# Patient Record
Sex: Female | Born: 1935
Health system: Southern US, Community
[De-identification: ages and names within clinical notes are randomized; demographics above are authoritative.]

---

## 2006-01-08 ENCOUNTER — Ambulatory Visit: Payer: Self-pay | Admitting: Specialist

## 2006-01-08 ENCOUNTER — Other Ambulatory Visit: Payer: Self-pay

## 2007-02-27 ENCOUNTER — Ambulatory Visit: Payer: Self-pay | Admitting: Specialist

## 2007-04-01 ENCOUNTER — Ambulatory Visit: Payer: Self-pay | Admitting: Specialist

## 2008-03-02 ENCOUNTER — Ambulatory Visit: Payer: Self-pay | Admitting: Specialist

## 2009-04-19 ENCOUNTER — Ambulatory Visit: Payer: Self-pay | Admitting: Specialist

## 2010-05-28 ENCOUNTER — Inpatient Hospital Stay: Payer: Self-pay | Admitting: Specialist

## 2010-06-20 ENCOUNTER — Ambulatory Visit: Payer: Self-pay | Admitting: Specialist

## 2010-10-15 ENCOUNTER — Inpatient Hospital Stay: Payer: Self-pay | Admitting: Internal Medicine

## 2010-10-27 ENCOUNTER — Ambulatory Visit: Payer: Self-pay | Admitting: Surgery

## 2010-11-03 ENCOUNTER — Ambulatory Visit: Payer: Self-pay | Admitting: Surgery

## 2011-04-04 ENCOUNTER — Ambulatory Visit: Payer: Self-pay | Admitting: Specialist

## 2011-12-24 ENCOUNTER — Ambulatory Visit: Payer: Self-pay | Admitting: Specialist

## 2012-11-30 IMAGING — CT CT ABD-PELV W/ CM
1 of 2 series · 15 of 32 positions shown, 19 images · IV contrast (isovue)
Comparison: None

REASON FOR EXAM: (1) pancreatitis; (2) pancreatitis
COMMENTS:

PROCEDURE:     CT  - CT ABDOMEN / PELVIS  W  - October 15, 2010  [DATE]
RESULT:     History: Pancreatitis
TECHNIQUE: Multiple axial images of the abdomen and pelvis were performed
from the lung bases to the pubic symphysis, with p.o. contrast and with 100
ml of Isovue 370 intravenous contrast.

[Series 2: 3mm soft tissue · axial · 0.66mm/px · z∈[-1598,-1202]mm · 15 of 145 slices shown, 19 images]
[im 7/145  soft-tissue]
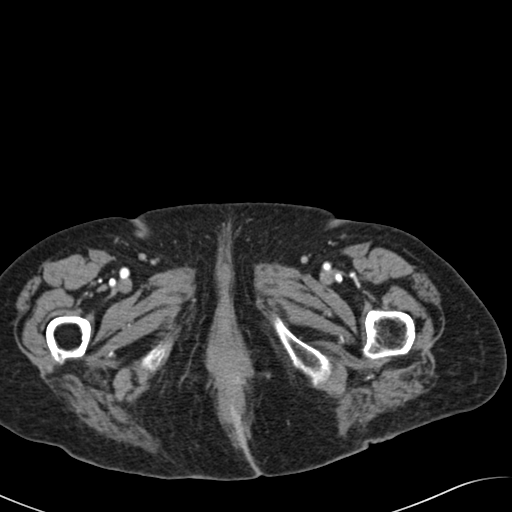
[im 7/145  bone]
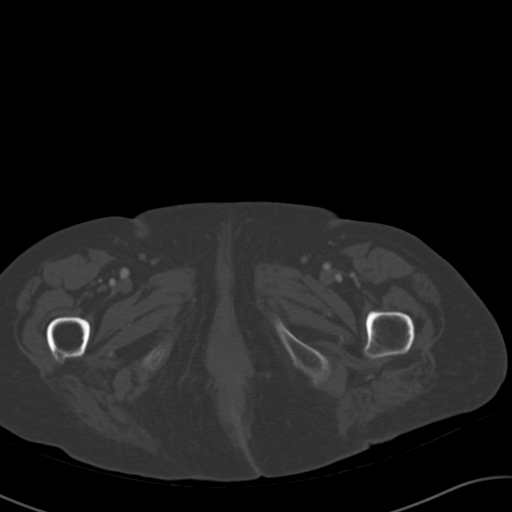
[im 19/145  soft-tissue]
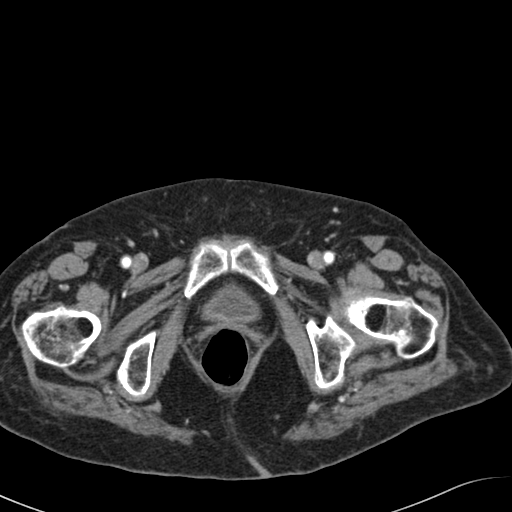
[im 31/145  soft-tissue]
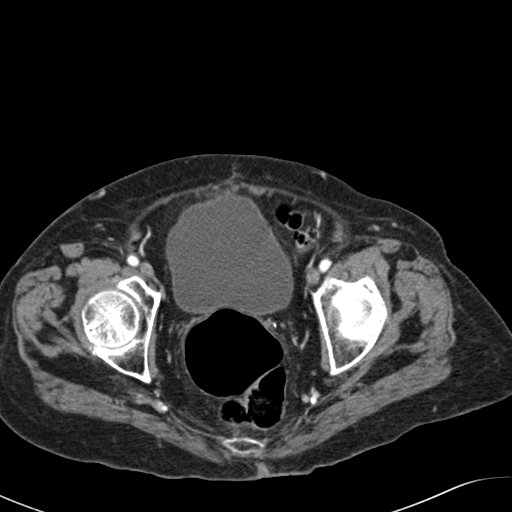
[im 43/145  soft-tissue]
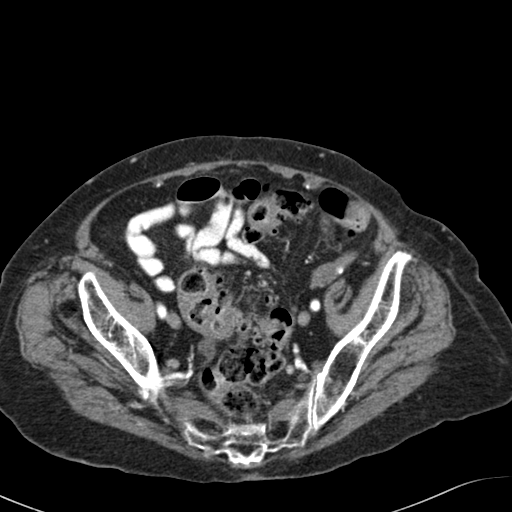
[im 49/145  soft-tissue]
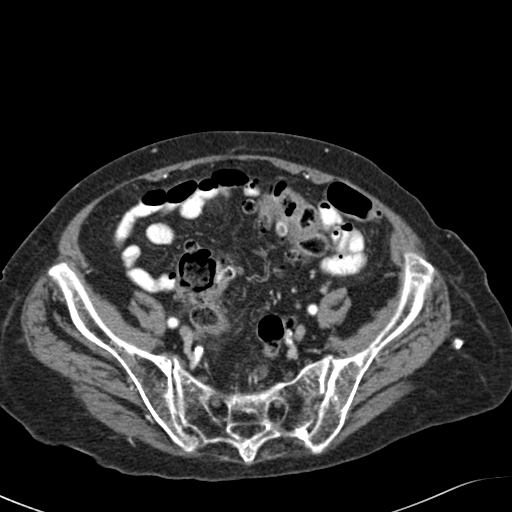
[im 61/145  soft-tissue]
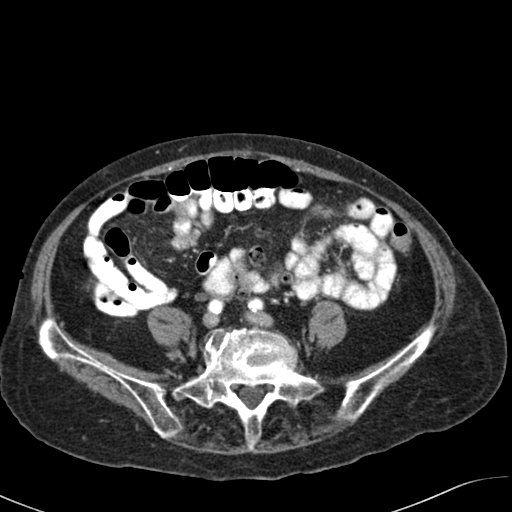
[im 73/145  soft-tissue]
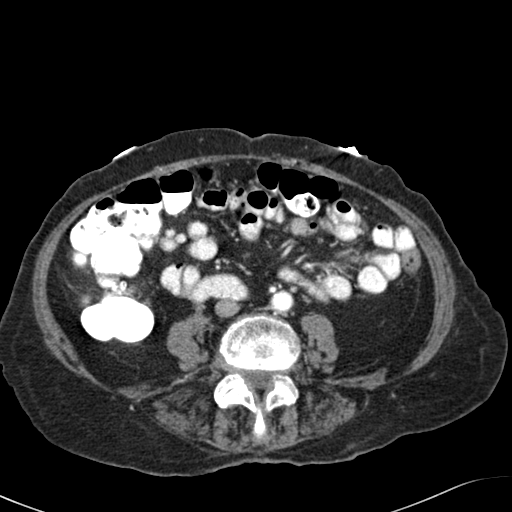
[im 85/145  soft-tissue]
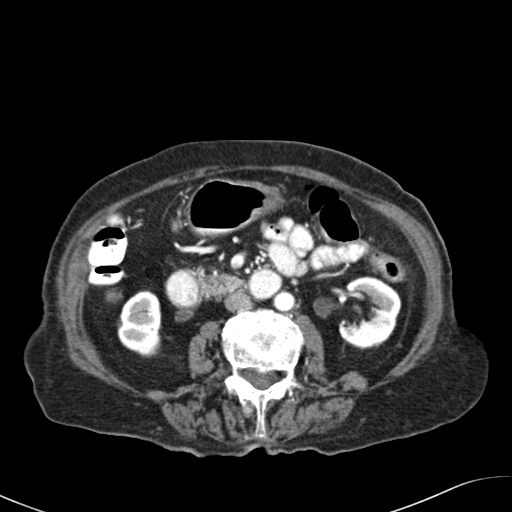
[im 97/145  soft-tissue]
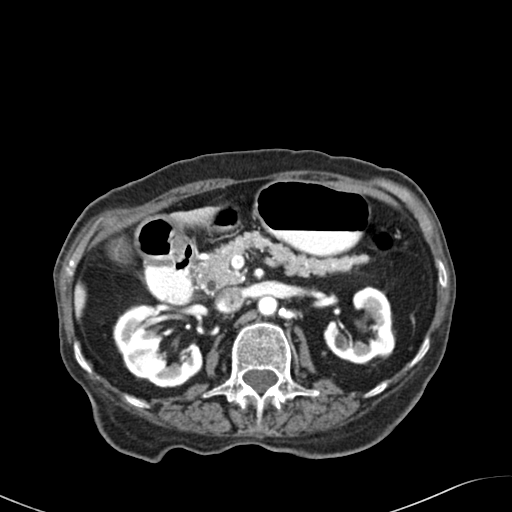
[im 97/145  bone]
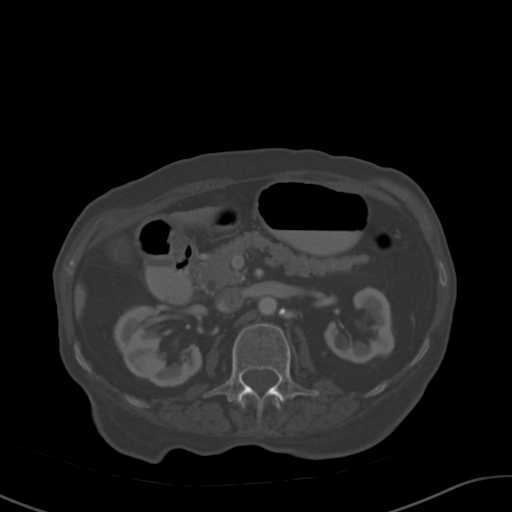
[im 103/145  soft-tissue]
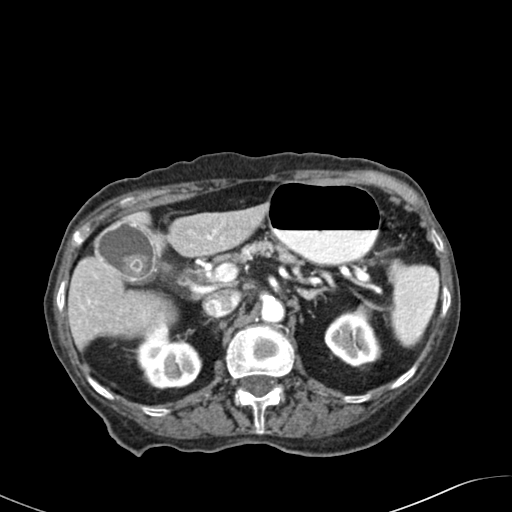
[im 115/145  soft-tissue]
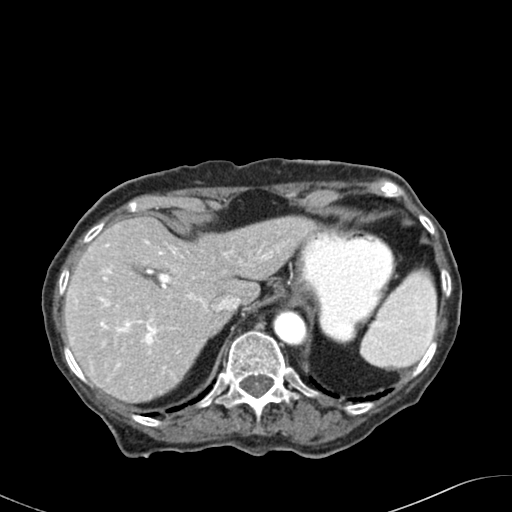
[im 121/145  lung]
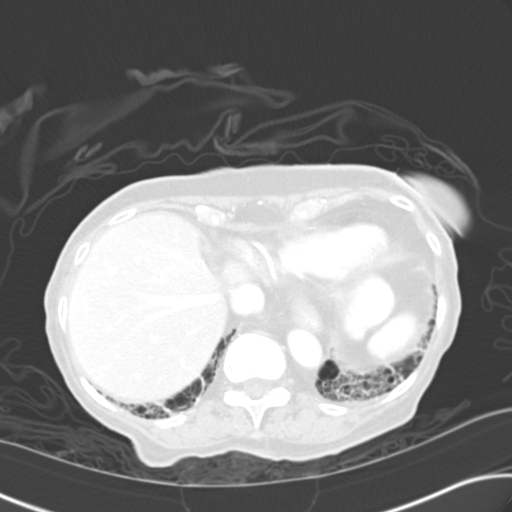
[im 127/145  soft-tissue]
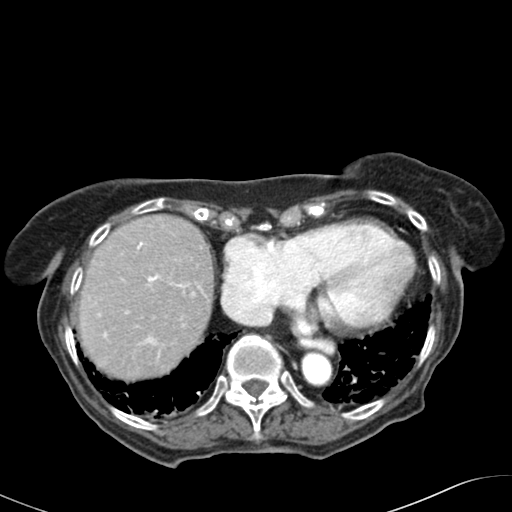
[im 127/145  lung]
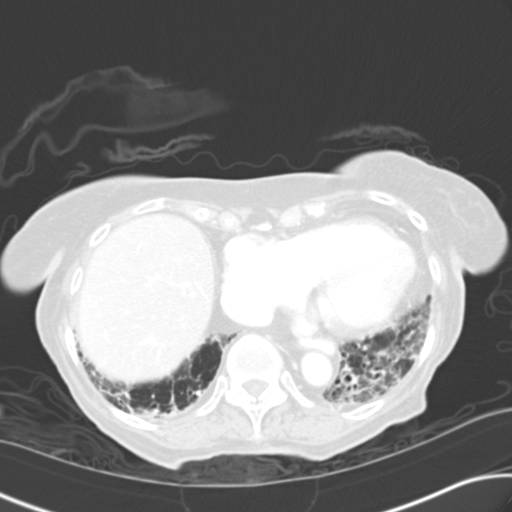
[im 133/145  lung]
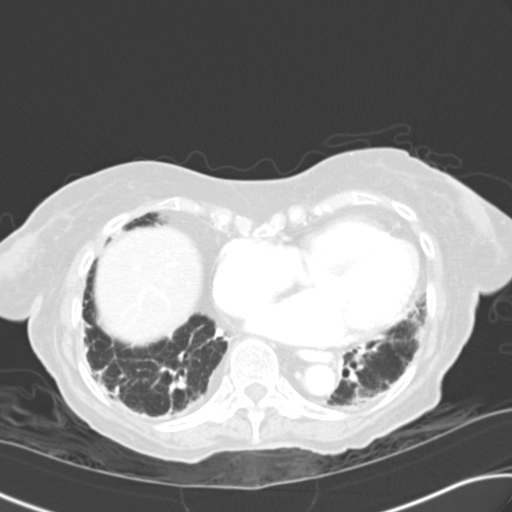
[im 139/145  soft-tissue]
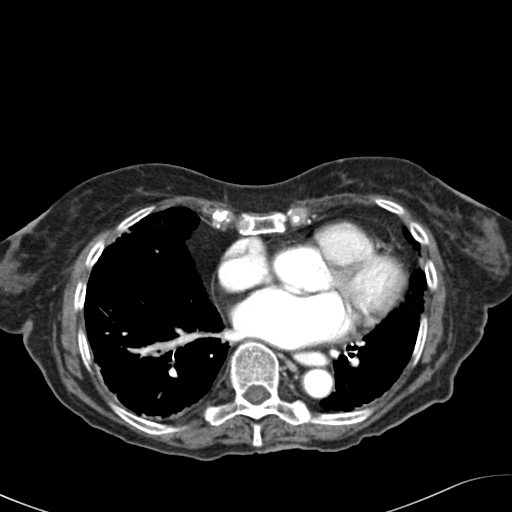
[im 139/145  lung]
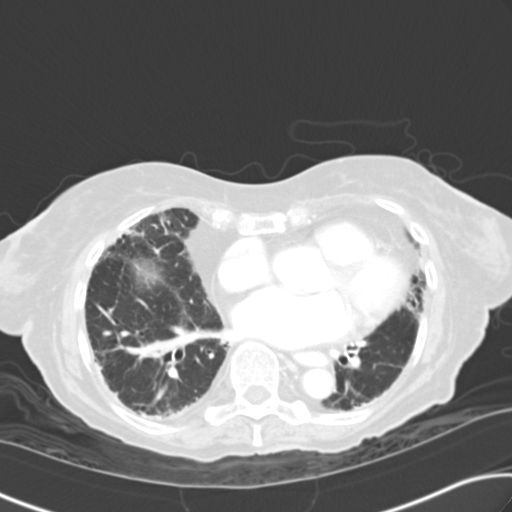

[15 of 32 positions shown; findings below may reference images not displayed]

FINDINGS: There is bilateral lower lobe fibrosis. There is no pneumothorax. The heart
size is normal.

The liver demonstrates no focal abnormality. There is no intrahepatic or
extrahepatic biliary ductal dilatation. The gallbladder is distended with
cholelithiasis. There is gallbladder wall thickening. The spleen
demonstrates no focal abnormality. The kidneys, adrenal glands, and pancreas
are normal. The bladder is unremarkable.

The stomach, duodenum, small intestine, and large intestine demonstrate no
contrast extravasation or dilatation. There is diverticulosis without
evidence of diverticulitis. There is no pneumoperitoneum, pneumatosis, or
portal venous gas. There is no abdominal or pelvic free fluid. There is no
lymphadenopathy.

The abdominal aorta is normal in caliber with atherosclerosis.

There is lumbar spine spondylosis.
IMPRESSION: 1. There is diverticulosis without evidence of diverticulitis.

2. Cholelithiasis with gallbladder wall thickening as can be seen with
cholecystitis. If there is further clinical concern regarding cholecystitis
recommend further evaluation with a HIDA scan.

## 2012-12-14 LAB — URINALYSIS, COMPLETE
Bilirubin,UR: NEGATIVE
Blood: NEGATIVE
Glucose,UR: NEGATIVE mg/dL (ref 0–75)
Nitrite: POSITIVE
Ph: 5 (ref 4.5–8.0)
RBC,UR: 2 /HPF (ref 0–5)
Specific Gravity: 1.013 (ref 1.003–1.030)
WBC UR: 29 /HPF (ref 0–5)

## 2012-12-14 LAB — CBC
HCT: 36.2 % (ref 35.0–47.0)
HGB: 12.5 g/dL (ref 12.0–16.0)
MCHC: 34.5 g/dL (ref 32.0–36.0)
MCV: 95 fL (ref 80–100)
Platelet: 227 10*3/uL (ref 150–440)
RBC: 3.8 10*6/uL (ref 3.80–5.20)
RDW: 15.3 % — ABNORMAL HIGH (ref 11.5–14.5)
WBC: 12.5 10*3/uL — ABNORMAL HIGH (ref 3.6–11.0)

## 2012-12-14 LAB — COMPREHENSIVE METABOLIC PANEL
Albumin: 3.6 g/dL (ref 3.4–5.0)
Calcium, Total: 10.1 mg/dL (ref 8.5–10.1)
Chloride: 100 mmol/L (ref 98–107)
EGFR (African American): >60
EGFR (Non-African Amer.): >60
Glucose: 87 mg/dL (ref 65–99)
Potassium: 3.9 mmol/L (ref 3.5–5.1)
Sodium: 133 mmol/L — ABNORMAL LOW (ref 136–145)
Total Protein: 7.7 g/dL (ref 6.4–8.2)

## 2012-12-15 ENCOUNTER — Observation Stay: Payer: Self-pay | Admitting: Internal Medicine

## 2012-12-15 LAB — CBC WITH DIFFERENTIAL/PLATELET
Eosinophil #: 0 10*3/uL (ref 0.0–0.7)
Eosinophil %: 0.1 %
HCT: 33.4 % — ABNORMAL LOW (ref 35.0–47.0)
HGB: 11.5 g/dL — ABNORMAL LOW (ref 12.0–16.0)
MCH: 32.8 pg (ref 26.0–34.0)
MCHC: 34.5 g/dL (ref 32.0–36.0)
MCV: 95 fL (ref 80–100)
Monocyte #: 0.1 x10 3/mm — ABNORMAL LOW (ref 0.2–0.9)
Monocyte %: 1.3 %
RBC: 3.51 10*6/uL — ABNORMAL LOW (ref 3.80–5.20)
RDW: 15.8 % — ABNORMAL HIGH (ref 11.5–14.5)

## 2012-12-15 LAB — CK TOTAL AND CKMB (NOT AT ARMC)
CK, Total: 187 U/L (ref 21–215)
CK, Total: 133 U/L (ref 21–215)
CK-MB: 3 ng/mL (ref 0.5–3.6)

## 2012-12-15 LAB — BASIC METABOLIC PANEL
Chloride: 101 mmol/L (ref 98–107)
Creatinine: 0.75 mg/dL (ref 0.60–1.30)
EGFR (African American): >60
Potassium: 4.1 mmol/L (ref 3.5–5.1)

## 2012-12-15 LAB — TROPONIN I: Troponin-I: 0.04 ng/mL

## 2012-12-17 LAB — BASIC METABOLIC PANEL
Co2: 27 mmol/L (ref 21–32)
Creatinine: 1.01 mg/dL (ref 0.60–1.30)
EGFR (African American): 54 — ABNORMAL LOW
EGFR (Non-African Amer.): 47 — ABNORMAL LOW
Potassium: 3.8 mmol/L (ref 3.5–5.1)

## 2013-01-09 ENCOUNTER — Inpatient Hospital Stay: Payer: Self-pay | Admitting: Student

## 2013-01-09 LAB — COMPREHENSIVE METABOLIC PANEL
Albumin: 2.9 g/dL — ABNORMAL LOW (ref 3.4–5.0)
Alkaline Phosphatase: 114 U/L (ref 50–136)
Anion Gap: 10 (ref 7–16)
BUN: 19 mg/dL — ABNORMAL HIGH (ref 7–18)
Bilirubin,Total: 0.6 mg/dL (ref 0.2–1.0)
Calcium, Total: 9.5 mg/dL (ref 8.5–10.1)
Chloride: 95 mmol/L — ABNORMAL LOW (ref 98–107)
EGFR (African American): >60
EGFR (Non-African Amer.): >60
Glucose: 104 mg/dL — ABNORMAL HIGH (ref 65–99)
Potassium: 3.8 mmol/L (ref 3.5–5.1)
SGOT(AST): 26 U/L (ref 15–37)
Sodium: 128 mmol/L — ABNORMAL LOW (ref 136–145)
Total Protein: 6.5 g/dL (ref 6.4–8.2)

## 2013-01-09 LAB — CBC
HGB: 11.9 g/dL — ABNORMAL LOW (ref 12.0–16.0)
MCH: 32.4 pg (ref 26.0–34.0)
MCV: 94 fL (ref 80–100)
Platelet: 257 10*3/uL (ref 150–440)
RBC: 3.68 10*6/uL — ABNORMAL LOW (ref 3.80–5.20)
RDW: 15.7 % — ABNORMAL HIGH (ref 11.5–14.5)
WBC: 12.2 10*3/uL — ABNORMAL HIGH (ref 3.6–11.0)

## 2013-01-09 LAB — URINALYSIS, COMPLETE
Bilirubin,UR: NEGATIVE
Blood: NEGATIVE
Nitrite: NEGATIVE
Ph: 5 (ref 4.5–8.0)
Protein: NEGATIVE
RBC,UR: 2 /HPF (ref 0–5)
Squamous Epithelial: <1

## 2013-01-10 LAB — CBC WITH DIFFERENTIAL/PLATELET
Basophil #: 0 10*3/uL (ref 0.0–0.1)
Basophil %: 0.4 %
Eosinophil #: 0.1 10*3/uL (ref 0.0–0.7)
HGB: 10.8 g/dL — ABNORMAL LOW (ref 12.0–16.0)
Lymphocyte #: 2 10*3/uL (ref 1.0–3.6)
Lymphocyte %: 22.5 %
MCH: 32.3 pg (ref 26.0–34.0)
MCV: 95 fL (ref 80–100)
Monocyte #: 0.7 x10 3/mm (ref 0.2–0.9)
Neutrophil #: 5.9 10*3/uL (ref 1.4–6.5)
Platelet: 230 10*3/uL (ref 150–440)
RBC: 3.35 10*6/uL — ABNORMAL LOW (ref 3.80–5.20)
RDW: 15.8 % — ABNORMAL HIGH (ref 11.5–14.5)

## 2013-01-10 LAB — COMPREHENSIVE METABOLIC PANEL
Albumin: 2.4 g/dL — ABNORMAL LOW (ref 3.4–5.0)
Alkaline Phosphatase: 95 U/L (ref 50–136)
BUN: 16 mg/dL (ref 7–18)
Chloride: 99 mmol/L (ref 98–107)
EGFR (African American): >60
EGFR (Non-African Amer.): >60
Glucose: 67 mg/dL (ref 65–99)
Potassium: 3.9 mmol/L (ref 3.5–5.1)
SGOT(AST): 21 U/L (ref 15–37)
Sodium: 130 mmol/L — ABNORMAL LOW (ref 136–145)

## 2013-01-13 LAB — BASIC METABOLIC PANEL
Anion Gap: 3 — ABNORMAL LOW (ref 7–16)
BUN: 10 mg/dL (ref 7–18)
Calcium, Total: 9.8 mg/dL (ref 8.5–10.1)
Chloride: 96 mmol/L — ABNORMAL LOW (ref 98–107)
Creatinine: 0.78 mg/dL (ref 0.60–1.30)
EGFR (Non-African Amer.): >60
Glucose: 109 mg/dL — ABNORMAL HIGH (ref 65–99)

## 2013-01-14 LAB — BASIC METABOLIC PANEL
Anion Gap: 5 — ABNORMAL LOW (ref 7–16)
BUN: 12 mg/dL (ref 7–18)
Chloride: 97 mmol/L — ABNORMAL LOW (ref 98–107)
Co2: 30 mmol/L (ref 21–32)
Creatinine: 0.76 mg/dL (ref 0.60–1.30)
EGFR (Non-African Amer.): >60
Glucose: 112 mg/dL — ABNORMAL HIGH (ref 65–99)
Potassium: 4 mmol/L (ref 3.5–5.1)

## 2013-01-14 LAB — TROPONIN I: Troponin-I: 0.05 ng/mL

## 2013-01-16 ENCOUNTER — Encounter: Payer: Self-pay | Admitting: Internal Medicine

## 2013-01-17 ENCOUNTER — Encounter: Payer: Self-pay | Admitting: Internal Medicine

## 2013-01-22 LAB — TSH: Thyroid Stimulating Horm: 6.14 u[IU]/mL — ABNORMAL HIGH

## 2013-01-25 LAB — FOLATE: Folic Acid: 27.7 ng/mL (ref 3.1–100.0)

## 2013-02-05 LAB — TSH: Thyroid Stimulating Horm: 6.02 u[IU]/mL — ABNORMAL HIGH

## 2013-02-05 LAB — MAGNESIUM: Magnesium: 1.3 mg/dL — ABNORMAL LOW

## 2013-02-16 ENCOUNTER — Ambulatory Visit: Payer: Self-pay | Admitting: Internal Medicine

## 2013-02-16 ENCOUNTER — Encounter: Payer: Self-pay | Admitting: Internal Medicine

## 2013-02-20 LAB — PATHOLOGY REPORT

## 2013-03-15 ENCOUNTER — Inpatient Hospital Stay: Payer: Self-pay | Admitting: Internal Medicine

## 2013-03-15 DIAGNOSIS — I359 Nonrheumatic aortic valve disorder, unspecified: Secondary | ICD-10-CM

## 2013-03-15 LAB — URINALYSIS, COMPLETE
Blood: NEGATIVE
Glucose,UR: NEGATIVE mg/dL (ref 0–75)
Ketone: NEGATIVE
Nitrite: NEGATIVE
Ph: 5 (ref 4.5–8.0)
Specific Gravity: 1.03 (ref 1.003–1.030)
Squamous Epithelial: <1
WBC UR: 78 /HPF (ref 0–5)

## 2013-03-15 LAB — BASIC METABOLIC PANEL
Anion Gap: 4 — ABNORMAL LOW (ref 7–16)
Calcium, Total: 9.4 mg/dL (ref 8.5–10.1)
Co2: 30 mmol/L (ref 21–32)
EGFR (African American): 52 — ABNORMAL LOW
Glucose: 106 mg/dL — ABNORMAL HIGH (ref 65–99)
Osmolality: 283 (ref 275–301)
Potassium: 4.2 mmol/L (ref 3.5–5.1)

## 2013-03-15 LAB — CBC
HCT: 37.7 % (ref 35.0–47.0)
MCHC: 32 g/dL (ref 32.0–36.0)
MCV: 89 fL (ref 80–100)
RBC: 4.25 10*6/uL (ref 3.80–5.20)
RDW: 16.9 % — ABNORMAL HIGH (ref 11.5–14.5)
WBC: 11.5 10*3/uL — ABNORMAL HIGH (ref 3.6–11.0)

## 2013-03-15 LAB — TROPONIN I: Troponin-I: 0.07 ng/mL — ABNORMAL HIGH

## 2013-03-16 DIAGNOSIS — I4891 Unspecified atrial fibrillation: Secondary | ICD-10-CM

## 2013-03-16 LAB — CBC WITH DIFFERENTIAL/PLATELET
Eosinophil #: 0.4 10*3/uL (ref 0.0–0.7)
Lymphocyte %: 12.1 %
MCH: 28.7 pg (ref 26.0–34.0)
MCV: 90 fL (ref 80–100)
Monocyte %: 5.1 %
Platelet: 286 10*3/uL (ref 150–440)
RBC: 4.46 10*6/uL (ref 3.80–5.20)
RDW: 17.2 % — ABNORMAL HIGH (ref 11.5–14.5)

## 2013-03-16 LAB — LIPID PANEL
HDL Cholesterol: 34 mg/dL — ABNORMAL LOW (ref 40–60)
Triglycerides: 118 mg/dL (ref 0–200)
VLDL Cholesterol, Calc: 24 mg/dL (ref 5–40)

## 2013-03-16 LAB — BASIC METABOLIC PANEL
Creatinine: 0.9 mg/dL (ref 0.60–1.30)
Osmolality: 278 (ref 275–301)
Potassium: 4.1 mmol/L (ref 3.5–5.1)

## 2013-03-17 LAB — BASIC METABOLIC PANEL
BUN: 30 mg/dL — ABNORMAL HIGH (ref 7–18)
Creatinine: 1.06 mg/dL (ref 0.60–1.30)
EGFR (Non-African Amer.): 44 — ABNORMAL LOW
Glucose: 137 mg/dL — ABNORMAL HIGH (ref 65–99)
Osmolality: 282 (ref 275–301)
Potassium: 4.4 mmol/L (ref 3.5–5.1)

## 2013-03-17 LAB — WBC: WBC: 18 10*3/uL — ABNORMAL HIGH (ref 3.6–11.0)

## 2013-03-19 ENCOUNTER — Encounter: Payer: Self-pay | Admitting: Internal Medicine

## 2013-03-19 DEATH — deceased

## 2013-04-19 ENCOUNTER — Ambulatory Visit: Payer: Self-pay | Admitting: Internal Medicine

## 2014-07-09 NOTE — Op Note (Signed)
PATIENT NAME:  Melanie Nunez, Melanie Nunez MR#:  [id number] DATE OF BIRTH:  05-16-1914  DATE OF PROCEDURE:  12/16/2012  PREOPERATIVE DIAGNOSIS: L2 compression fracture.   POSTOPERATIVE DIAGNOSIS:  L2 compression fracture.   PROCEDURE:  L2 biopsy and kyphoplasty.   ANESTHESIA:  MAC.   SURGEON:  Coltrane Tugwell.   DESCRIPTION OF PROCEDURE:  The patient was brought to the operating room and after adequate sedation was given, the patient was placed prone. C-arm was brought in and good visualization of the AP and lateral planes was obtained of L2. The patient identification and timeout procedures were completed, and 5 mL of 1% Xylocaine was infiltrated on both sides for potential incision. Next, the back was prepped and draped in the usual sterile fashion, and a repeat timeout procedure was carried out. The right-sided skin incision was made and the trocar advanced in an extrapedicular fashion and advanced into the vertebral body. A biopsy was obtained. There was good fill with first drilling past  the midline felt that only the right side needed to be accessed. A balloon was placed and inflated, followed by cement insertion. The balloon was inflated approximately 4 mL with correction of superior endplate deformity. The balloon was then deflated and cement inserted. With 4.5 mL placed, this gave very good fill with good interdigitation. After the cement had set, the trocar was removed, and wound closed with Dermabond followed by a Band-Aid. The patient was then sent to the recovery room in stable condition.   ESTIMATED BLOOD LOSS: Minimal.   COMPLICATIONS: None.   SPECIMEN: L2 vertebral body biopsy.     ____________________________ Leitha SchullerMichael J. Tkai Serfass, MD mjm:dmm D: 12/16/2012 21:44:30 ET T: 12/16/2012 22:05:28 ET JOB#: [id number]  cc: Leitha SchullerMichael J. Rashidi Loh, MD, <Dictator> Leitha SchullerMICHAEL J Farhiya Rosten MD ELECTRONICALLY SIGNED 12/16/2012 22:21

## 2014-07-09 NOTE — Consult Note (Signed)
Brief Consult Note: Diagnosis: L2 vertebral compression fracture.   Patient was seen by consultant.   Comments: Discussed MRI findings with patient and her granddaughter. Patient was seen earlier by Dr. Rosita KeaMenz and is tentatively scheduled for kyphoplasty tomorrow.  Granddaughter was directed to the [email or web address] website for additional information.  Electronic Signatures: Donato HeinzHooten, James P (MD)  (Signed 29-Sep-14 19:09)  Authored: Brief Consult Note   Last Updated: 29-Sep-14 19:09 by Donato HeinzHooten, James P (MD)

## 2014-07-09 NOTE — Op Note (Signed)
PATIENT NAME:  Melanie Nunez, [name] MR#:  [id number] DATE OF BIRTH:  07-Mar-1915  DATE OF PROCEDURE:  01/13/2013  PREOPERATIVE DIAGNOSIS: T10 and T12 compression fractures with osteoporosis T11.   POSTOPERATIVE DIAGNOSIS: T10 and T12 compression fractures with osteoporosis T11.    PROCEDURE: T10, T11 and T12 kyphoplasty with biopsy of T11 and T12.   ANESTHESIA: MAC.   SURGEON: Kennedy BuckerMichael Kelie Gainey, M.D.   DESCRIPTION OF PROCEDURE: The patient was brought to the operating room and after adequate anesthesia was obtained with sedation, the patient was placed prone. The back was prepped with alcohol, and with good visualization at each level, 1% Xylocaine was infiltrated subcutaneously. Repeat timeout procedure was completed after prepping and draping, and a spinal needle was used to inject down to the vertebral body on both sides at T10 which was the most compressed, on the right at T11 and on the left at T12. Small stab incisions were made, first on the right at T10. Biopsy was not possible. The bone was very soft, and the biopsy needle appeared to fall into a cleft and there was no good bone tissue. The balloon was inflated following this, and it did cross the midline with 4 mL inflation, with good correction of the very compressed T10 vertebral body. T11 was approached on the right as well with biopsy obtained and inflation to 3 mL with low pressure consistent with very osteoporotic bone. On the left, a small stab incision was made, trocar advanced, again checking on AP and lateral projections to be sure it was safe, not within the spinal canal or within the neural foramen. Biopsy obtained. Inflation to 3 mL. At this point the balloons were deflated, and sequential filling of the vertebral bodies was carried out with approximately 4.5 mL in T10 and 3 mL in the remaining levels with good fill and no extravasation. The trocars were removed and AP and lateral images obtained. The wounds were closed with Dermabond  followed by Band-Aids. At the close of the case, the patient was somewhat hypotensive and became quite bradycardic. She was sent to the recovery room in fair condition where medical evaluation was continued with external pacer applied, and she was subsequently sent to CCU in fair condition with bradycardia and hypotension that occurred prior to placing of the bone cement.   SPECIMEN: T11 and T12 vertebral body biopsies.   ____________________________ Leitha SchullerMichael J. Ladon Heney, MD mjm:gb D: 01/13/2013 22:09:14 ET T: 01/13/2013 22:25:07 ET JOB#: [id number]  cc: Leitha SchullerMichael J. Walsie Smeltz, MD, <Dictator> Leitha SchullerMICHAEL J Lisha Vitale MD ELECTRONICALLY SIGNED 01/14/2013 0:01

## 2014-07-09 NOTE — Consult Note (Signed)
Brief Consult Note: Diagnosis: Borderline elevated troponin, neg 2nd trop, no CP, no ECG changes, doubt ACS.   Patient was seen by consultant.   Consult note dictated.   Comments: REC  Agree with current therapy, defer full dose anticoagulation, defer further noninvasive or invasive cardiac eval.  Electronic Signatures: Marcina MillardParaschos, Eschol Auxier (MD)  (Signed 29-Sep-14 12:37)  Authored: Brief Consult Note   Last Updated: 29-Sep-14 12:37 by Marcina MillardParaschos, Jaelynn Currier (MD)

## 2014-07-09 NOTE — Discharge Summary (Signed)
PATIENT NAME:  Melanie Nunez, Melanie Nunez MR#:  [id number] DATE OF BIRTH:  [birth year]  DATE OF ADMISSION:  12/15/2012 DATE OF DISCHARGE:  12/17/2012  ADMITTING PHYSICIAN: Dr. Randol Kern   DISCHARGING PHYSICIAN:  Enid Baas, MD   PRIMARY CARE PHYSICIAN: Dr. Ronna Polio   CONSULTATIONS IN THE HOSPITAL:  1.  Cardiology consultation by Dr. Darrold Junker.   2.  Orthopedic consultation by Dr. Rosita Kea.   DISCHARGE DIAGNOSES:  1.  Acute back pain.  2.  Acute L2 compression fracture, status post kyphoplasty.  3.  Transient bradycardia with atrial flutter while in the hospital.  4.  Urinary tract infection.  5.  Glaucoma.   DISCHARGE MEDICATIONS: 1.  Calcium/Vitamin D 1 tablet p.o. b.i.d.  2.  Metoprolol tartrate 12.5 mg p.o. daily in the morning.  3.  Advair 250/50 1 puff b.i.d.  4.  Dorzolamide and timolol ophthalmic drops twice a day.  5.  Oxybutynin 5 mg p.o. b.i.d.  6.  Cosopt eyedrops to both eyes as scheduled.  7.  Combivent inhaler 2 puffs q. 6 hours p.r.n.  8.  Tramadol 50 mg q. 6 hours p.r.n. for pain.  9.  Levaquin 250 mg p.o. daily for 2 more days.   DISCHARGE DIET: Low-sodium diet.   DISCHARGE ACTIVITY: As tolerated.    FOLLOWUP INSTRUCTIONS: 1.  PCP follow-up in 3 to 4 weeks.  2.  Home health physical therapy.  3.  Follow up with orthopedics in two weeks.   LABORATORY AND IMAGING STUDIES PRIOR TO DISCHARGE:  Sodium 130, potassium 3.8, chloride 99, bicarb 27, BUN 20, creatinine 1.01, glucose 175 and calcium of 9.1, magnesium 1.3 which was replaced.   MRI of the lumbar spine without contrast showing multilevel, multifactorial areas of thecal sac stenosis, severe at L3-L4 and L4-L5 levels, compression deformity at L2 could be acute to subacute, ASF, neuroforaminal narrowing are present.   WBC 6.7, hemoglobin 11.4, hematocrit 33.4, platelet count 204.   Sodium 134, potassium 4.1, chloride 101, bicarbonate 24, BUN 18, creatinine 0.75, glucose 134 and calcium of 9.4. Troponins  negative.   CT of the lumbar spine showing possible compression fraction of L3 and L1, acute fracture involving L2 vertebral body anteriorly is seen. Multilevel disk protrusions and areas of spinal canal stenosis and neuroforaminal stenosis seen.   Urinalysis with 1+ leukocyte esterase, nitrite positive, 3+ bacteria  and 30 WBCs.   BRIEF HOSPITAL COURSE: Ms. Droll is a 81 year old elderly Caucasian female who has past medical history significant for chronic obstructive pulmonary disease, glaucoma, urinary incontinence who is pretty independent at baseline and lives at home by herself, presented to the hospital secondary to low back pain. The patient denies any trauma or recent fall or lifting something heavy.   1.  Acute low back pain this point.  She denies any mechanical trauma, bending. Possible pathological fractures secondary to osteoporosis. The patient had a noted compression fracture noted on CT and also confirmed by MRI. Seen by orthopedics and had kyphoplasty done by Dr. Rosita Kea on 12/16/2012. Postoperatively the patient's pain has improved a lot, worked with physical therapy and was requiring just minimal assistance from getting out of bed or chair. They recommended home PT and the patient will be going home with 24 hour care and also home physical therapy. The patient has not required any postoperative pain medication, so narcotics are not being dispatched and she is being discharged only on tramadol as needed for now.  2.  Urinary tract infection. Unfortunately, cultures were not sent. She  was on Rocephin in the hospital and will finish off by taking two more days of Levaquin. She has not been febrile or has a high white count.  3.  Transient bradycardia, possible has a history of underlying atrial fibrillation.  No EKG was done on admission, EKG in the hospital showing atrial flutter. She takes low-dose metoprolol 12.5 mg daily at home and her heart rate has been pretty stable in the range of  80 to 100 in the hospital, but had an episode postoperatively of bradycardia where her heart rate went down into 40s. A few hours after she received metoprolol; however, she has not been symptomatic and her heart rate improved up to 70s prior to discharge without any intervention. So no medication changes were made, and her family was advised on how to check her pulse or if she gets symptomatic to bring her back to the ER. All her other home medications were continued. Her course has been otherwise uneventful.   DISCHARGE CONDITION: Stable.   DISCHARGE DISPOSITION: Home with home health.   TIME SPENT ON DISCHARGE: 45 minutes.   CODE STATUS: DO NOT RESUSCITATE as confirmed by patient at the time of admission   TIME SPENT ON DISCHARGE: 45 minutes.   ____________________________ Enid Baasadhika Iceis Knab, MD rk:cc D: 12/17/2012 15:54:33 ET T: 12/17/2012 20:00:30 ET JOB#: [id number]  cc: Enid Baasadhika Chevonne Bostrom, MD, <Dictator> Ginette PitmanJennifer A. Dan HumphreysWalker, MD Leitha SchullerMichael J. Menz, MD Enid BaasADHIKA Georg Ang MD ELECTRONICALLY SIGNED 12/31/2012 14:25

## 2014-07-09 NOTE — H&P (Signed)
PATIENT NAME:  Melanie Nunez, WNUK MR#:  [id number] DATE OF BIRTH:  1914/08/25  DATE OF ADMISSION:  03/15/2013  PRIMARY CARE PHYSICIAN: It used to be Dr. Leavy Cella, but now it is Ronna Polio.   CHIEF COMPLAINT: Status post fall.   HISTORY OF PRESENT ILLNESS: This is a very nice 81 year old female who lives in a skilled nursing facility. Apparently, the patient was having lunch, and during the time she was having lunch, she had an episode where she slumped down and fell, hitting her head where she has a little hematoma and abrasion. The patient was brought to get examined for those circumstances and when she was here, she was found to be tachycardic with a heart rate in the 120s to 130s, in atrial fibrillation. Orders for metoprolol were given by the ER physician. The patient was not able to give me much information as she is a little bit confused. I am not quite sure if she has dementia or this is her baseline, and we were not able to reach any family member as her family member lives far away. The patient was found to have a blood pressure of 89/64, for what I stopped the metoprolol and instead of that loaded her with digoxin. Digoxin actually worked significantly for decreasing her heart rate, and after 1 liter of fluids and probably a half more liter, the patient started recovering her blood pressure. The patient is admitted for treatment of this condition.   REVIEW OF SYSTEMS: Unable to obtain as the patient is confused.    PAST MEDICAL HISTORY:  1. COPD.   2. Incontinence.  3. Vertigo.  4. Atrial fibrillation, not on anticoagulation due to falls.  5. Seasonal allergies.  6. L2 compression fracture.    ALLERGIES: No known drug allergies.   PAST SURGICAL HISTORY: Colon cancer removal 18 inches when she was young and a kyphoplasty of L2.   SOCIAL HISTORY: The patient lives in a retirement home. She does not drink, does not use drugs. She apparently has 3 sons, and she told me they live far  away.   FAMILY HISTORY: Positive for leukemia in her father who also died from pneumonia.   CURRENT MEDICATIONS: Vitamin B12 1000 mcg daily, Tylenol as needed for pain or headache, tramadol as needed for pain, oxybutynin 5 mg twice daily, omeprazole 20 mg daily, milk of magnesia as needed for constipation, metoprolol 12.5 mg once daily, magnesium oxide 250 mg twice daily, guaifenesin 600 mg twice daily, Ensure 1 can once a day, dorzolamide with timolol both eyes twice daily, calcium plus vitamin D twice daily, Advair Diskus 250/50 twice daily.   PHYSICAL EXAMINATION:  VITAL SIGNS: Blood pressure down to 89/64. After IV fluids, started to raise to 114/55. Her heart rate was 120 to 128; right now it is in the 70s. Temperature 97.8, respiratory rate 20 to 28. Oxygen saturation of 95% on room air.  GENERAL: The patient is confused, pleasant, not in any acute distress but looks lethargic, looks dehydrated, looks ill.  HEENT: Her pupils are equal and reactive. Extraocular movements are intact. Mucosa is moist.  NECK: Supple. No JVD. No thyromegaly. No adenopathy.  CARDIOVASCULAR: Irregularly irregular with a heart rate in the130. No murmurs, rubs or gallops. No displacement of PMI. No tenderness to palpation of anterior chest wall.  LUNGS: Clear without any wheezing or crepitus. No use of accessory muscles.  ABDOMEN: Soft, nontender, nondistended. No hepatosplenomegaly. No masses. Bowel sounds are positive.  GENITAL: Negative for external lesions.  EXTREMITIES: No edema, cyanosis, clubbing.  VASCULAR: Pulses +2. Capillary refill less than 3.  SKIN: No rashes, petechiae or ecchymoses. No bleeding.  LYMPHATIC: Negative for lymphadenopathy in the neck or supraclavicular areas.  RECTAL: Exam is deferred.  MUSCLES AND JOINTS: Fine without any significant edema or erythema.  NEUROLOGIC: Cranial nerves II through XII are intact. Strength is 5/5 in 4 extremities.  PSYCHIATRIC: No significant agitation. The  patient is alert and oriented to person mostly.   FINDINGS: CT scan of the head: No significant acute abnormalities. CT scan of the C-spine: No significant acute fractures. There is mild degenerative disk disease at C6, C7.   Chest x-ray: Chronic interstitial lung disease with fibrosis and underlying COPD.   Glucose 106, BUN 31, creatinine 1.05. Electrolytes otherwise within normal limits. Potassium 4.2. GFR is around 45. Magnesium was 2.5. Troponin was 0.07, and the second troponin was 0.08. Hemoglobin 11.5.   White blood cells in urine 78, red blood cells 16, leukocyte esterase +3.   EKG: Atrial fibrillation with RVR. No ST depression or elevation.   ASSESSMENT AND PLAN: This is a very nice 81 year old female with a history of atrial fibrillation, chronic obstructive pulmonary disease/interstitial fibrosis of the lung, urinary incontinence and hypertension. Comes with a history of a fall. Etiology of the fall was mostly secondary to syncope. The syncope could be related to:  1. Dehydration and hypotension: Likely, the patient was orthostatic, although the patient never raised. She was sitting down eating a meal. Eating the evening meal could increase the circulation on the splenic veins and arteries and would decrease her blood pressure even further. The other option is the patient had a urinary tract infection that could produce the hypotension, and the other thing was her atrial fibrillation with rapid ventricular response. She could have just gone into acute coronary syndrome or atrial fibrillation with rapid ventricular response with demand ischemia and that made her pass out. At this moment, we are treating all of these conditions. Intravenous fluids have been given for hypotension. Digoxin has been given to slow down the atrial fibrillation. Since the patient had hypotension, we were not able to do calcium channel blockers or beta blockers, for what digoxin was our best option. Digoxin  intravenous has been given and orders for loading been given as well. The patient actually is more stable right now. She had a good response to the digoxin. Since she has a urinary tract infection, we are going to treat her with Rocephin and follow up urine cultures. Intravenous fluids at 75 mL an hour. The patient is not a good candidate for anticoagulation due to multiple falls.  2. As far as hypertension, hold metoprolol for now.  3. As far as gastrointestinal prophylaxis, the patient is on Protonix.  4. Deep vein thrombosis prophylaxis: On Lovenox.   The patient is DNR. The patient is at high risk of cardiovascular collapse, shock and cardiac arrest due to her increased heart rate, infection and significant hypotension that required fluid resuscitation.   CRITICAL CARE TIME: 45 minutes.   ____________________________ Felipa Furnaceoberto Sanchez Gutierrez, MD rsg:gb D: 03/15/2013 21:31:04 ET T: 03/15/2013 21:55:44 ET JOB#: [id number]  cc: Felipa Furnaceoberto Sanchez Gutierrez, MD, <Dictator> Ninetta Adelstein Juanda ChanceSANCHEZ GUTIERRE MD ELECTRONICALLY SIGNED 03/16/2013 1:01

## 2014-07-09 NOTE — H&P (Signed)
PATIENT NAME:  Melanie Nunez, Melanie Nunez MR#:  [id number] DATE OF BIRTH:  [birth year]  DATE OF ADMISSION:  01/09/2013  PRIMARY CARE PHYSICIAN: Dr. Ronna Polio.  The patient is a 81 year old Caucasian female with past medical history significant for history of just recent discharge from the hospital a few weeks ago for acute back pain due to L2 fracture, status post kyphoplasty by Dr. Rosita Kea, comes back to the hospital with complaints of worsening low back pain. The patient's pain is nonradiating in the back. She denies any falls; however, she states that she woke up today in the morning and could not move. She did not get out from her bed today. She had a few physical therapy evaluations and work last week, as well as one this week on Wednesday, but apparently she did not do anything today since she was not able to get out from the bed.   PAST MEDICAL HISTORY:  Significant for history of recent admission from the 12/15/2012 through 12/17/2012, for acute back pain due to L2 compression fracture, status post kyphoplasty. During the same admission, the patient had transient bradycardia with atrial flutter while in the hospital, urinary tract infection, for which she was treated, as well as history of glaucoma.   OTHER MEDICAL PROBLEMS:  Are as follows: COPD, urinary incontinence, vertigo, history of Afib, not a candidate for anticoagulation, seasonal allergies.   PAST SURGICAL HISTORY: Colon cancer status post partial colectomy, as well as recent kyphoplasty on L2.   ALLERGIES: No known drug allergies.   MEDICATIONS: According to medical records, the patient is on Advair 250/50, 1 puff twice daily, calcium with vitamin D 1 tablet twice daily, dorzolamide/timolol ophthalmic solution 1 drop to each affected eye twice daily, metoprolol tartrate 25 mg p.o. once daily and oxybutynin 5 mg p.o. twice daily   SOCIAL HISTORY: The patient lives in a retirement home; however, now, she is in a rehabilitation facility. She  has 3 sons. No tobacco, alcohol abuse or illicit drug abuse.   FAMILY HISTORY:  Leukemia in the patient's father. The patient's mother died of pneumonia.   REVIEW OF SYSTEMS:   CONSTITUTIONAL:  Difficult to obtain as the patient is somnolent. Admits of having back pain, glaucoma, admits of having intermittent headaches, as well as some nausea over the past few days. She states that she worked with physical therapy just 2 days ago quite well. She admits of having some cough, as well as wheezes, which seems to be chronic. She admits of having some yellow phlegm, which also seems to be chronic all her life. Admits of having some sinus congestion, also urge incontinence.  She uses pads.  Denies any fevers, chills, fatigue, weakness, weight loss or gain.    EYES:  In regards to eyes, no blurry vision, double vision, cataracts.    ENT: Denies any tinnitus, allergies, epistaxis, sinus pain, dentures, difficulty swallowing. RESPIRATORY: Denies hemoptysis, asthma or COPD.  CARDIOVASCULAR: Denies any chest pain, orthopnea, arrhythmia, palpitations or syncope. GASTROINTESTINAL: Denies nausea, vomiting, diarrhea or constipation.  GENITOURINARY: Denies dysuria, hematuria, frequency.  ENDOCRINOLOGY: Denies any polydipsia, nocturia, thyroid problems, heat or cold intolerance, or thirst.  HEMATOLOGIC:  Denies any anemia, easy bruising, bleeding or swollen glands.  SKIN: Denies any acne, rash, lesions or change in moles.  MUSCULOSKELETAL: Denies arthritis, cramps, swelling.  NEUROLOGICAL: No numbness, epilepsy, tremor.  PSYCHIATRIC: Denies anxiety, insomnia or depression.  PHYSICAL EXAMINATION: VITAL SIGNS: On arrival to the hospital, temperature is 98.3, pulse 71, respiratory rate was 18, blood  pressure 138/62, saturation was 96% on room air.  GENERAL: This is a well-developed, well-nourished, thin Caucasian female, pale and weak  and somewhat somnolent, lying on the stretcher.  HEENT: Pupils equal, reactive to  light. Extraocular movements intact. No icterus or conjunctivitis. Has normal hearing. No pharyngeal erythema. Mucosa is dry.  NECK: No masses. Supple, nontender. Thyroid is not enlarged. No adenopathy. No JVD or carotid bruits bilaterally. Full range of motion.  LUNGS: Dry crackles were heard on all lung auscultation sides. No rhonchi or wheezing. No labored inspirations, increased effort, dullness to percussion or overt respiratory distress.  CARDIOVASCULAR: S1, S2 appreciated. No murmurs, gallops or rubs were noted. Irregularly irregular.  PMI not lateralized.  Chest is nontender to palpation.  1+ pedal pulses. No lower extremity edema, calf tenderness or cyanosis were noted.  ABDOMEN: Soft, nontender. Bowel sounds are present.  No hepatosplenomegaly or masses were noted.  RECTAL: Deferred.  MUSCLE STRENGTH: Able to move all extremities. Able to lift up her lower extremities well. No cyanosis, degenerative joint disease. Not able to assess for kyphosis.  Gait is not tested.  SKIN: Did not reveal any rashes, lesions, erythema, nodularity or induration. It was warm and dry to palpation.  LYMPHATIC: No adenopathy in the cervical region.  NEUROLOGICAL: Cranial nerves grossly intact. Sensory is intact. No dysarthria or aphasia. The patient is somnolent, however, on waking up, she is alert and oriented to time, person and  place, cooperative. Memory is impaired, but no confusion, agitation or depression noted.    LABORATORY AND RADIOLOGICAL DATA: Glucose 104, BUN 19, sodium 128. Otherwise BMP was unremarkable. The patient's albumin level is 2.9. Otherwise comprehensive metabolic panel was normal. Troponin 0.03. White blood cell count is elevated to 12.2, hemoglobin 11.9, platelet count 257.  URINALYSIS: Yellow clear urine. Negative for glucose of bilirubin, 1+ ketones, specific gravity 1.011, pH was 5.0, negative for blood, protein, nitrites or leukocyte esterase, 2 red blood cells, 6 white blood cells,  trace bacteria, less than 1 epithelial cell. Mucus was present, as well as 3 hyaline casts.   RADIOLOGIC STUDIES: Lumbar spine AP and lateral of 01/09/2013, revealed postsurgical changes in the lumbar spine. The patient had prior L2 vertebroplasty as described above. Stable severe L3 compression fracture. Stable moderate L1 compression fracture. T10 moderate compression fracture, which may be new since the study, according to radiologist.   ASSESSMENT AND PLAN: 1.  Back pain. Admit the patient to the medical floor. Get Dr. Rosita KeaMenz to see the patient in consultation. Continue the patient on pain medications at this time. Get physical therapy whenever she is able to get out from bed.  2.  Dehydration. Continue IV fluids.  3.  Hyponatremia, IV fluids.  4.  Leukocytosis. Get chest x-ray and get urine cultures. We will not initiate antibiotic therapy at this time yet.   TIME SPENT: 50 minutes.      ____________________________ Katharina Caperima Vittoria Noreen, MD rv:dmm D: 01/09/2013 18:28:41 ET T: 01/09/2013 19:12:50 ET JOB#: [id number]  cc: Katharina Caperima Olivine Hiers, MD, <Dictator> Ginette PitmanJennifer A. Dan HumphreysWalker, MD Katharina CaperIMA Myracle Febres MD ELECTRONICALLY SIGNED 01/18/2013 04:5420:02

## 2014-07-09 NOTE — H&P (Signed)
PATIENT NAME:  Melanie Nunez, GENSON MR#:  [id number] DATE OF BIRTH:  August 29, 1914  DATE OF ADMISSION:  12/14/2012  REFERRING PHYSICIAN: Dr. Jimmye Norman.   PRIMARY CARE PHYSICIAN: Dr. Clayborn Bigness. Currently, she is supposed to start seeing Dr. Gilford Rile as an outpatient but she has not seen him yet.   CHIEF COMPLAINT: Lower back pain.   HISTORY OF PRESENT ILLNESS: This is a 81 year old female in usually good state of health, lives at home. She presents today with complaints of lower back pain as well as hip pain. She reports the pain started over the last 2 days. Denies any recent injury or fall or carrying something heavy. Describes the pain as dull, nonradiating. Denies any lower extremity focal deficits, tingling, numbness, any urinary or stool incontinence. The pain is worsened by movement and relieved by rest. The patient had a CT of the lumbar spine which did show multiple findings, including L2 fracture and multilevel disk protrusion causing spinal canal stenosis and foraminal stenosis. The patient's pain is much improved after getting p.o. prednisone in the ED and Percocet. The patient as well was found to have incidental finding of troponin at 0.06. The patient denies any chest pain, any shortness of breath. Her EKG did not show any significant changes from previous. The patient was given aspirin and Lovenox in the ED. As well, the patient was found to have positive urinalysis. She denies any polyuria or dysuria. Denies as well any fever or chills. Hospitalist service was requested to admit the patient for further evaluation of her positive troponin and lower back pain and UTI treatment.   PAST MEDICAL HISTORY:  1. COPD.   2. Glaucoma.  3. Urinary incontinence.  4. Vertigo.  5. History of Afib, not a candidate for anticoagulation.  6. Seasonal allergies.   PAST SURGICAL HISTORY: Colon cancer status post partial colectomy.   ALLERGIES: No known drug allergies.   HOME MEDICATIONS:  1. Vicodin 5/500 two  tabs every 4 hours as needed.  2. Meclizine 25 mg oral daily as needed.  3. Metoprolol tartrate 12.5 mg oral daily.  4. Advair Diskus 250/50 one puff b.i.d.  5. Combivent as needed.  6. Calcium 600 with vitamin D 1 tablet 2 times a day.  7. Oxybutynin 5 mg oral 2 times a day.  8. Cosopt ophthalmic drops 1 drop both eyes twice a day.   SOCIAL HISTORY: The patient lives at a retirement home. She has 3 sons. No tobacco. No alcohol abuse. No illicit drug use.   FAMILY HISTORY: Significant for leukemia in her father. Mother died secondary to pneumonia.   REVIEW OF SYSTEMS:  CONSTITUTIONAL: The patient denies fever, chills, weight gain, weight loss. Denies any weakness. Complains of lower back pain.  EYES: Denies blurry vision, double vision, inflammation. Has history of glaucoma.  ENT: Denies tinnitus, ear pain, hearing loss, epistaxis.  RESPIRATORY: Denies cough, wheezing, hemoptysis, dyspnea. Has history of COPD.   CARDIOVASCULAR: Denies chest pain, edema, arrhythmia, palpitation, syncope.  GASTROINTESTINAL: Denies nausea, vomiting, diarrhea, abdominal pain, melena, jaundice, rectal bleed.  GENITOURINARY: Denies dysuria, hematuria, renal colic.  ENDOCRINE: Denies polyuria, polydipsia, heat or cold intolerance.  HEMATOLOGY: Denies anemia, easy bruising, bleeding diathesis.  INTEGUMENT: Denies acne, rash or skin lesions.  MUSCULOSKELETAL: Complains of lower back pain. Denies any cramps, swelling or gout.  NEUROLOGIC: Denies any weakness, dysarthria, ataxia, dementia, headache, CVA, TIA. Denies any lower extremity weakness, any tingling, any numbness, any altered sensation, any urinary or stool incontinence  PSYCHIATRIC: Denies anxiety, insomnia,  bipolar disorder or schizophrenia.   PHYSICAL EXAMINATION:  VITAL SIGNS: Temperature 97.3, pulse 68, respiratory rate 18, blood pressure 151/62, saturating 93% on room air.  GENERAL: Elderly female, looks comfortable, in no apparent distress.   HEENT: Head is atraumatic, normocephalic. Pupils equal, reactive to light. Pink conjunctivae. Anicteric sclerae. Moist oral mucosa.  NECK: Supple. No thyromegaly. No JVD.  CHEST: Good air entry bilaterally. No wheezing, rales or rhonchi.  CARDIOVASCULAR: S1, S2 heard. No rubs, murmurs or gallops. Irregularly irregular rhythm.  ABDOMEN: Soft, nontender, nondistended. Bowel sounds present.  EXTREMITIES: No edema. No clubbing. No cyanosis  PSYCHIATRIC: Appropriate affect. Awake, alert x 3. Intact judgment and insight.  NEUROLOGIC: Cranial nerves grossly intact. Motor 5 out of 5 in all extremities. No focal deficits. Lower extremity has sensation symmetrical and intact to light touch.  MUSCULOSKELETAL: The patient had negative leg raise test. Reports her pain has much improved after the p.o. pain medication. Has tenderness to palpation in the lower back area.  LYMPHATICS: No cervical or supraclavicular lymphadenopathy.    PERTINENT LABORATORIES: Glucose 87, BUN 19, creatinine 0.79, sodium 133, potassium 3.9, chloride 100, CO2 26. ALT 19, AST 30, alk phos 104. Troponin 0.06. White blood cells 12.5, hemoglobin 12.5, hematocrit 36.2, platelets 227. Urinalysis positive for nitrite and +1 leukocyte esterase and 29 white blood cells, +3 bacteria.   IMAGING: CT of the lumbar spine without contrast showing probably chronic compression deformity at L3 and L1. Acute fracture involving the L2 vertebral body anteriorly and in the mid portion without retropulsion of bony material. Multilevel disk protrusion with areas of spinal canal stenosis and foraminal stenosis.   ASSESSMENT AND PLAN:  1. Low back pain: This appears to be due to lumbar radiculopathy with acute L2 fracture and multilevel disk protrusion with spinal canal and foraminal stenosis. The patient will be admitted for physical therapy and orthopedic evaluation. She will be kept on p.r.n. p.o. and intravenous pain medications as needed. She was already  given p.o. prednisone in the Emergency Department. The patient had no focal deficits on physical exam.   2. Elevated troponin: The patient denies chest pain. Denies shortness of breath. There are no EKG changes. She received Lovenox and aspirin in the Emergency Department. Will consult cardiology service. Will admit to telemetry and will continue to cycle her cardiac enzymes and follow the trend.  3. Urinary tract infection: The patient will be started on Rocephin. Will follow on urine culture.  4. Chronic obstructive pulmonary disease: Does not appear to be in exacerbation. There is no wheezing. Continue with p.r.n. DuoNebs and continue with Advair.  5. Glaucoma: Continue with Cosopt.  6. Urinary incontinence: Continue with oxybutynin.  7. Vertigo: Continue with p.r.n. meclizine.  8. History of atrial fibrillation: The patient appears to be in atrial fibrillation but rate controlled. She is deemed not a candidate for anticoagulation. Will continue with p.o. metoprolol.  9. Deep vein thrombosis prophylaxis: The patient received 1 full dose anticoagulation in the Emergency Department. Will await cardiac evaluation prior to decide continuing anticoagulation as prophylaxis versus full treatment dose.   CODE STATUS: Discussed with the patient. Reports she has a Living Will. She has 3 sons. The eldest son is her healthcare power of attorney and reports she is DNR. Reports "I did live a good 96 years of my life and if anything happens, I am ready to go."   TOTAL TIME SPENT ON ADMISSION AND PATIENT CARE: 55 minutes.   ____________________________ Albertine Patricia, MD dse:gb D:  12/15/2012 00:20:04 ET T: 12/15/2012 00:50:15 ET JOB#: [id number]  cc: Albertine Patricia, MD, <Dictator> Denzil Bristol Graciela Husbands MD ELECTRONICALLY SIGNED 12/16/2012 1:07

## 2014-07-09 NOTE — Consult Note (Signed)
PATIENT NAME:  Melanie Nunez, Lashana F MR#:  [id number] DATE OF BIRTH:  1914/03/25  CARDIOLOGY CONSULTATION  DATE OF CONSULTATION:  12/15/2012  REFERRING:  [facility].  REFERRING PHYSICIAN: Borderline-elevated troponin.   CHIEF COMPLAINT: "Low back pain."   HISTORY OF PRESENT ILLNESS: The patient is a 81 year old female who was admitted on 12/14/2012 with a chief complaint of low back pain. The patient has had dull, nonradiating low back pain for 2 days. She denies any recent falls.   Lumbar CT scan revealed an L2 fracture, multiple-level disk protrusion and spinal canal stenosis. The patient was treated with prednisone. Initial troponin was 0.06. The patient denies chest pain. EKG was nondiagnostic. Follow-up troponin was normal at 0.04   PAST MEDICAL HISTORY:  1.  Paroxysmal atrial fibrillation.  2.  COPD. 3.  Urinary incontinence.  4.  Glaucoma.  5.  Vertigo.   MEDICATIONS: Metoprolol tartrate 12.5 mg daily, Vicodin 5/50, 2 tablets q.4 h. p.r.n., meclizine 25 mg p.r.n., Advair Diskus 250/50, 1 puff b.i.d., Combivent p.r.n., calcium 600 mg with vitamin D, 1 to 2 tablets daily, oxybutynin 5 mg b.i.d., Cosopt ophthalmic drops, one drop twice a day.   SOCIAL HISTORY: The patient currently lives at a retirement home. She denies tobacco or EtOH abuse.   FAMILY HISTORY: No immediate family history for coronary artery disease or myocardial infarction.   REVIEW OF SYSTEMS:   CONSTITUTIONAL: No fever or chills.  EYES: No blurry vision.  EARS: No hearing loss.  RESPIRATORY: The patient has some mild chronic exertional dyspnea due to underlying COPD.  CARDIOVASCULAR: The patient denies chest pain.  GASTROINTESTINAL: The patient denies nausea, vomiting, constipation. Does have some intermittent diarrhea.  GENITOURINARY: No dysuria or hematuria.  ENDOCRINE: No polyuria or polydipsia.  HEMATOLOGICAL: No easy bruising or bleeding.  INTEGUMENTARY: No rash.  MUSCULOSKELETAL: The patient has low back  pain, as described above.  NEUROLOGIC: The patient denies focal weakness or numbness.  PSYCHOLOGICAL: The patient denies depression or anxiety.   PHYSICAL EXAM: VITAL SIGNS: Blood pressure 169/62, pulse 93, respirations 18, temperature 98.3, pulse ox 97%.  HEENT: Pupils equal and reactive to light and accommodation.  NECK: Supple, without thyromegaly.  LUNGS: Clear.  HEART: Normal JVP. Normal PMI. Regular rate and rhythm. Normal S1, S2. No appreciable gallop, murmur, or rub.  ABDOMEN: Soft and nontender. Pulses were intact bilaterally.  MUSCULOSKELETAL: Normal muscle tone.  NEUROLOGIC: The patient is alert and oriented x 3. Motor and sensory both grossly intact.   IMPRESSION: This is a 81 year old female who presents with lumbar back pain in the absence of chest pain, with a single borderline elevation troponin which was negative on followup. I seriously doubt this is an acute coronary syndrome.   RECOMMENDATIONS: 1.  I agree with overall current therapy.  2.  Defer full-dose anticoagulation.  3. Defer further noninvasive or invasive cardiac evaluation at this time.    ____________________________ Marcina MillardAlexander Ostin Mathey, MD ap:dm D: 12/15/2012 12:35:35 ET T: 12/15/2012 13:08:54 ET JOB#: [id number]  cc: Marcina MillardAlexander Jakyrie Totherow, MD, <Dictator> Marcina MillardALEXANDER Davan Hark MD ELECTRONICALLY SIGNED 12/29/2012 11:25

## 2014-07-09 NOTE — Consult Note (Signed)
General Aspect Melanie Nunez is a 81 yo with hx of chronic AF ( not on anticoagulation because of hx of falling), COPD,   . [facility] with severe dyspnea and syncopal episode.  she was found to have rapid Atrial fib and we were called to see.  She was also found to have significant COPD exacerbation.  she was found to be hypotensive and metoprolol was stopped.  Digoxin was started.   she had a 3 sec pause overnight.   Physical Exam:  GEN cachectic, critically ill appearing   HEENT pink conjunctivae   NECK No masses   RESP postive use of accessory muscles  wheezing  rhonchi  severe rhonchi, wheezes bilaterally   CARD Irregular rate and rhythm   ABD denies tenderness   EXTR negative edema   SKIN No rashes   NEURO motor/sensory function intact   PSYCH alert   Review of Systems:  General: No Complaints   Eyes: No Complaints   Respiratory: several weeks of dyspnea.   Cardiovascular: Chest pain or discomfort     Fractured vertebrae 10/14:    A Fib:    glauoma:    hx of migraines:    hysteretomy:    Vertigo:    Colon CA:    kyphoplasty: Oct 2014   colon resection:   Home Medications: Medication Instructions Status  Tylenol 325 mg oral tablet 2 tabs (650mg ) orally every 4 hours as needed pain or fever greater than 100.4 *not to exceed a total of 3 gms in 24 hours* Active  Advair Diskus 250 mcg-50 mcg inhalation powder 1 puff(s) inhaled 2 times a day Active  oxybutynin 5 mg oral tablet 1 tab(s) orally 2 times a day Active  dorzolamide-timolol ophthalmic 1 drop(s) to both eyes 2 times a day for glaucoma Active  omeprazole 20 mg oral delayed release capsule 1 cap(s) orally once a day before breakfast. *do not crush* Active  Vitamin B-12 1000 mcg oral tablet 1 tab(s) orally once a day Active  traMADol 50 mg oral tablet 1 tab(s) orally every 4 hours, As Needed - for Pain Active  Milk of Magnesia 8% oral suspension 30 milliliter(s) orally once  a day as needed for constipation Active  Tylenol 500 mg oral tablet 1 tab(s) orally every 8 hours for pain. Active  metoprolol tartrate 25 mg oral tablet 0.5 tab (12.5mg ) orally once a day Active  guaiFENesin 600 mg oral tablet, extended release 1 tab(s) orally 2 times a day Active  magnesium oxide 250 mg oral tablet 1 tab(s) orally 2 times a day with breakfast and dinner Active  Calcium 600+D 1 tab(s) orally 2 times a day with (breakfast and dinner). Active  ensure 1 can orally 2 times a day Active    No Known Allergies:   Vital Signs/Nurse's Notes: **Vital Signs.:   29-Dec-14 00:49  Vital Signs Type Routine  Temperature Temperature (F) 97.3  Celsius 36.2  Temperature Source axillary  Pulse Pulse 91  Respirations Respirations 18  Systolic BP Systolic BP 117  Diastolic BP (mmHg) Diastolic BP (mmHg) 76  Mean BP 89  Pulse Ox % Pulse Ox % 93  Oxygen Delivery 2L    04:55  Vital Signs Type Routine  Temperature Temperature (F) 97.5  Celsius 36.3  Temperature Source oral  Pulse Pulse 86  Respirations Respirations 36  Systolic BP Systolic BP 129  Diastolic BP (mmHg) Diastolic BP (mmHg) 75  Mean BP 93  Pulse Ox % Pulse  Ox % 78  Pulse Ox Activity Level  At rest  Oxygen Delivery 2L  Pulse Ox After Adjustment (RN or RCP Only) % 82  Oxygen Delivery Adjusted To (RN or RCP Only)  3L    07:10  Vital Signs Type Recheck  Pulse Ox % Pulse Ox % 82  Oxygen Delivery 5L    07:20  Vital Signs Type Recheck  Pulse Ox % Pulse Ox % 84  Oxygen Delivery 5L    08:00  Vital Signs Type Q 4hr  Temperature Temperature (F) 98.5  Celsius 36.9  Temperature Source oral  Pulse Pulse 82  Respirations Respirations 30  Systolic BP Systolic BP 128  Diastolic BP (mmHg) Diastolic BP (mmHg) 77  Mean BP 94  Pulse Ox % Pulse Ox % 85  Pulse Ox Activity Level  At rest  Oxygen Delivery Venturi Mask  *Intake and Output.:   29-Dec-14 01:57  Urinary Method  Incontinent  Unmeasured Output  Void    06:33   Grand Totals Intake:  800 Output:      Net:  800 24 Hr.:  800  IV (Primary)      In:  750  IV (Secondary)      In:  50    Shift 07:00  Grand Totals Intake:  800 Output:      Net:  800 24 Hr.:  800  IV (Primary)      In:  750  IV (Secondary)      In:  50  Length of Stay Totals Intake:  800 Output:      Net:  800    Daily 07:00  Grand Totals Intake:  800 Output:      Net:  800 24 Hr.:  800  IV (Primary)      In:  750  IV (Secondary)      In:  50  Length of Stay Totals Intake:  800 Output:      Net:  800    08:25  Grand Totals Intake:  0 Output:      Net:  0 24 Hr.:  0  Oral Intake      In:  0  Percentage of Meal Eaten  0    Shift 15:00  Grand Totals Intake:  0 Output:      Net:  0 24 Hr.:  0  Oral Intake      In:  0  Length of Stay Totals Intake:  800 Output:      Net:  800    Impression 1. Chronic atrial fib -  2. COPD 3. respiratory failure -  Melanie Nunez is admitted with a syncopal episode that appears to be due to a COPD exacerbation.  her rapid rate yesterday was likely due to her respiratory failure.  Metoprolol was held for hypotension.    she still has significant increase work of breathing today.  I would  expect her HR to be higher.  In addition, she had a pause overnight.  I would stop the Digoxin for now.  would anticipate resuming the metoprolol as she improves.  would restart tomorrow since her BP is now back up .  given her age and slightly diminished mental status, I wonder if she is aspirating.  she did not report cough or chronic dyspnea prior to this.   Electronic Signatures: Nahser, Antony Blackbird (MD)  (Signed 29-Dec-14 11:30)  Authored: General Aspect/Present Illness, History and Physical Exam, Review of System, Past Medical History, Home Medications, Allergies, Vital  Signs/Nurse's Notes, Impression/Plan   Last Updated: 29-Dec-14 11:30 by Nahser, Antony Blackbird (MD)

## 2014-07-09 NOTE — Consult Note (Signed)
Chief Complaint:  Subjective/Chief Complaint 81 year old female with complaints of lower back pain preventing her from  getting out of bed or ambulating.  X-rays show a T-10 compression fracture of modest nature.  Had recent Kyphoplasty by Dr. Rosita KeaMenz 12/16/12.  Recommend symptomatic care and contact Dr Rosita KeaMenz on Monday as he not working this weekend.   Radiology Results: XRay:    24-Oct-14 13:58, Lumbar Spine AP and Lateral  Lumbar Spine AP and Lateral   REASON FOR EXAM:    low back pain  COMMENTS:       PROCEDURE: DXR - DXR LUMBAR SPINE AP AND LATERAL  - Jan 09 2013  1:58PM     RESULT: Comparison made to prior study of 12/14/2012. Diffuse degenerative   changes lumbar spine both hips with mild scoliosis concave right.   Surgical clips are present in the right upper quadrant.    Patient's had prior L2 kyphoplasty. Stable severe compression fracture of   L3 present. Stable moderate compression fracture of L1.     T10 compression fracture is present, this is moderate and may be new.     Radiopacity projected over the posterior back is labeled " sponge",     clinical correlation suggested.    IMPRESSION:   1. Postsurgical changes lumbar spine. Patient's had prior L2 vertebral   plasty as described above.  2. Stable severe L3 compression fracture. Stable moderate L1 compression   fracture.   3. T10 moderate compression fracture. This may be new from prior study.        Verified By: Gwynn BurlyHOMAS E. REGISTER, M.D., MD   Electronic Signatures: Valinda HoarMiller, Maikol Grassia E (MD)  (Signed 25-Oct-14 21:40)  Authored: Chief Complaint, Radiology Results   Last Updated: 25-Oct-14 21:40 by Valinda HoarMiller, Anival Pasha E (MD)

## 2014-07-09 NOTE — Discharge Summary (Signed)
PATIENT NAME:  Melanie Nunez, WORTHEY MR#:  [id number] DATE OF BIRTH:  01/27/1915  DATE OF ADMISSION:  01/09/2013 DATE OF DISCHARGE:  01/15/2013  PRIMARY CARE PHYSICIAN: She will be following with Dr. Yates Decamp at [facility].  CONSULTANTS: Here were Dr. Deeann Saint and Dr. Rosita Kea from orthopedics and physical therapy.   CHIEF COMPLAINT: Back pain.   DISCHARGE DIAGNOSES: 1.  T10-T12 fracture status post kyphoplasty of  T10, 11 and 12 for compression fractures.  2.  History of recent admission for acute back pain and L2 compression fracture status post kyphoplasty. 3.  While hospitalized recently did have transient bradycardia with flutter while in the hospital.  4.  Urinary tract infection.  5.  Glaucoma.  6  Chronic obstructive pulmonary disease.  7.  Urinary incontinence.  8.  History of vertigo.  9.  History of chronic atrial fibrillation, not a candidate for anticoagulation per chart.  10.  Seasonal allergies.   DISCHARGE MEDICATIONS:  1.  Calcium 600 plus D 1 tab 2 times a day. 2.  Advair 250/50 mcg 1 puff 2 times a day. 3.  Metoprolol tartrate 25 mg 1/2 tab once a day in the morning. 4.  Oxybutynin 5 mg 1 tab 2 times a day. 5.  Dorzolamide/timolol 1 drop to each affected eye 2 times a day. 6.  Acetaminophen 650 mg every 4 hours as needed for pain or temperature. 7.  Acetaminophen 500 mg every 8 hours. 8.  Amoxicillin 875 mg every 12 hours for 2 days.   DIET: Low sodium.   ACTIVITY: As tolerated.   DISCHARGE FOLLOWUP:  1.  Please follow with Dr. Dan Humphreys as previously scheduled early next week.  2.  Please follow with Dr. Lady Gary on 01/26/2013 at 2:45 p.m. and if low heart rate, dizziness, lightheadedness or skipped beats hold metoprolol and call your doctor right away.   CODE STATUS: The patient is DO NOT RESUSCITATE.  SIGNIFICANT LABS AND IMAGING: Urine cultures grew 80,000 CFU of enterococcus faecalis sensitive to ampicillin and Cipro. Initial WBC 12.2, hemoglobin 11.9,  platelets 257. Troponins were negative on the day of admission. Initial LFTs showed albumin of 2.9, otherwise within normal limits. Initial BUN 19, creatinine 0.69, sodium 128. Magnesium on October 29 was 1.5.  MRI on October 27th showed limited exam secondary to motion artifact, but prior vertebroplasty at L2. There is stable compression fractures of L1 and L3. There is new severe compression fracture of T10 and new mild compression fracture at T12. Stable degenerative changes with stable spinal stenosis of L3-L4 and L4-L5.  HISTORY OF PRESENT ILLNESS AND HOSPITAL COURSE: For full details of H and P, please see the dictation on day of admission, but briefly this is a pleasant 81 year old female with recent admission and discharge on October 1 for compression fracture status post kyphoplasty who came in to the hospital again for another episode of back pain and was noted to have a compression fracture, admitted to the hospitalist service for pain control with orthopedic consult. The patient underwent a MRI which the results of is dictated above. The patient ultimately underwent kyphoplasty of T10, 11 and 12 with Dr. Rosita Kea. The patient was seen by PT. Pain is controlled most of the time and she was started on around-the-clock Tylenol as well as Tylenol p.r.n.   The patient did have symptomatic bradycardia postop and did require atropine. She was transferred to CCU transiently and was transferred back out to tele the following day. The patient had her metoprolol  held. The patient does have underlying A-fib and does have bradycardia episodes. The patient had a similar presentation last hospitalization. The patient did have short bouts of pauses, from 2 to 4 seconds, and I did discuss with cardiology given her age and history of A-fib it is okay to resume the low-dose metoprolol and we have made a follow-up appointment with [facility] where she will be following up with a new physician as a  primary care. She did have urine culture positive for enterococcus and she will be discharged on 2 more days of amoxicillin. Her COPD is stable. She did have mild hyponatremia and that appears to be chronic. The patient did have low magnesium on October 29 and that was repleted. At this point, [facility]. The patient is DNR.   PHYSICAL EXAMINATION: VITAL SIGNS: On the day of discharge, she is not acutely distressed. Temperature is 97.6, pulse rate 86, respiratory rate 20, blood pressure 106/67 and O2 sat 97% on room air.  GENERAL: The patient is a frail, elderly female lying in bed in no obvious distress.  HEENT: Normocephalic, atraumatic. Moist mucous membranes.  NECK: Supple.  CARDIOVASCULAR: S1 and S2, irregularly irregular.  LUNGS: Clear with good air entry.  ABDOMEN: Soft, nontender.  EXTREMITIES: Show no pitting edema.   She will be discharged as dictated above.   TOTAL TIME SPENT: 35 minutes.  ____________________________ Krystal EatonShayiq Kyilee Gregg, MD sa:sb D: 01/15/2013 12:10:39 ET T: 01/15/2013 12:21:22 ET JOB#: [id number]  cc: Krystal EatonShayiq Kysha Muralles, MD, <Dictator> John B. Danne HarborWalker III, MD Krystal EatonSHAYIQ Kahleah Crass MD ELECTRONICALLY SIGNED 02/05/2013 2:59

## 2014-07-10 NOTE — Discharge Summary (Signed)
PATIENT NAME:  Melanie Nunez, Melanie Nunez MR#:  [id number] DATE OF BIRTH:  1914-10-22  DATE OF ADMISSION:  03/15/2013 DATE OF DISCHARGE:  03/17/2013  DISCHARGE DESTINATION:  Hospice Home.   ADMITTING DIAGNOSIS: Fall, likely syncope.   DISCHARGE DIAGNOSES:  1.  Acute respiratory failure due to right-sided aspiration pneumonia and chronic obstructive pulmonary disease exacerbation due to pneumonia.  2.  Aspiration pneumonitis.  3.  Atrial fibrillation, rapid ventricular response due to chronic obstructive pulmonary disease exacerbation.  4.  Congestive heart failure, acute pulmonary edema, acute diastolic.  5.  Hypertension  6.  Leukocytosis.  7.  Urinary tract infection, gram-negative for rods; ID to follow.    DISCHARGE CONDITION: Poor.   DISCHARGE MEDICATIONS: The patient is being discharged on Sulamyd timolol ophthalmic solution one drop to each affected eye twice daily, lorazepam 0.5 mg oral tablet 1 to 2 tablets sublingually every 2 to 4 hours as needed, Motrin 0.25 mg which is 5 mL orally every 1 to 2 hours as needed. Oxygen as needed.  DIET: As tolerated.   PALLATIVE CARE: Dr. Harvie Junior, Mr. Windy Fast. Cardiologist, Dr. Elease Nunez. Care management, social work.   RADIOLOGIC STUDIES:  Chest x-ray portable single view on the 28th of December, 2014, revealed chronic interstitial lung disease, fibrosis and underlying COPD, enlargement of cardiac silhouette was noted, but no acute abnormalities were noted at that time. A CT scan of cervical spine without contrast, the 28th of December, 2014 showed no evidence of acute intracranial hemorrhage or involved ischemic infarction. There were noted age-related changes of chronic small vessel ischemia with mild diffuse cerebral, as well as cerebellar atrophy. There was no evidence of acute cervical spine fracture or dislocation. Mild degenerative disk disease was noted in the C6 to C7; this was actually a CT of head without contrast results. CT of cervical spine  findings: cervical vertebral bodies were preserved at height, intervertebral disk space was well maintained. There was mild diffuse calcification posteriorly at the C6-C7 level with a mild bulge there. There was no evidence of facet or spinous process fracture. The paravertebral soft tissues tissue spaces appeared to be normal. The odontoid was intact and the lateral masses of C1 aligned normally with those of C2. The observed portions of the first and second ribs appeared normal. The pulmonary apices appeared parenchymal scarring.  Repeat chest x-ray, portable single view of the 29th of December, 2014, revealed bilateral small pleural effusion with bilateral basal atelectasis or infiltrate. Mild interstitial prominence bilaterally without convincing pulmonary edema were noted. CT scan of her chest with IV contrast the 29th of December, 2014 to rule out pulmonary embolism showed no pulmonary embolus, right lower lobe pneumonia with parapneumonic effusion seen of the right middle and lower lobe of  main bronchi, chronic interstitial lung disease, most prominent in the left noted as well as coronary cardiomegaly.   HOSPTIAL COURSE: The patient is an 81 year old Caucasian female with past medical history significant for history of COPD, incontinence, A. fib, who  apparently was eating lunch and suddenly she slumped down and fell down hitting her head. She was brought to the hospital for further evaluation. In the Emergency Room, she was noted to be tachycardic with heart rate of  120s to 30s and she was in A. fib. She was given metoprolol. She was also noted to be hypotensive with systolic blood pressure 90s/60s and increasingly hypoxic. She was admitted to the hospital for further evaluation and hospitalist services were contacted for admission. Her initial chest x-ray done  in the Emergency Room was unremarkable. However, the patient was noted to be progressively more and more hypoxic, requiring oxygen at a  highest/low nasal cannula as well as Venturi mask. Finally, she was placed on BiPAP with noninvasive  oxygenation therapy. CT scan of the chest was done to rule out pulmonary embolism; however, no pulmonary embolism was found, but right-sided pneumonia. The patient bronchus was also filled with fluid concerning for possible aspiration pneumonitis. She was initiated on broad-spectrum antibiotic therapy including meropenem and Zithromax because of COPD  which  accompanied her right-sided pneumonia. She was initiated on high doses of steroids inhalation therapy; however, her condition did not improve significantly over a period of time. She was seen by palliative care physician, Dr. Harvie JuniorPhifer, as well as nurse practitioner, Mr. Windy FastBorders, and patient's family, after discussion with them, decided on comfort care measures. The patient will be [facility] today on the 30th of December,2014 for final management of her medical problems.  On the day of discharge, her vitals: Temperature was 97.8, pulse was 80, respiratory rate from 24 to 40s, blood pressure ranging 116 to 130s systolic and 60s to 70s diastolic. O2 sats were 97 to 98% on BiPAP at rest.   TIME SPENT: 40 minutes.   ____________________________ Katharina Caperima Melanie Rosello, MD rv:NTS D: 03/17/2013 17:41:54 ET T: 03-Apr-2012 00:56:15 ET JOB#: [id number]  cc: Katharina Caperima Melanie Taha, MD, <Dictator> Ginette PitmanJennifer A. Dan HumphreysWalker, MD Katharina CaperIMA Stanislawa Gaffin MD ELECTRONICALLY SIGNED 03/24/2013 14:33

## 2015-02-25 IMAGING — CR DG LUMBAR SPINE 2-3V
1 series · 3 of 3 positions shown · non-contrast
Comparison: none

REASON FOR EXAM: low back pain
COMMENTS:

[Series 1: ap · 0.17mm/px · 3 of 3 slices shown]
[im 1/3]
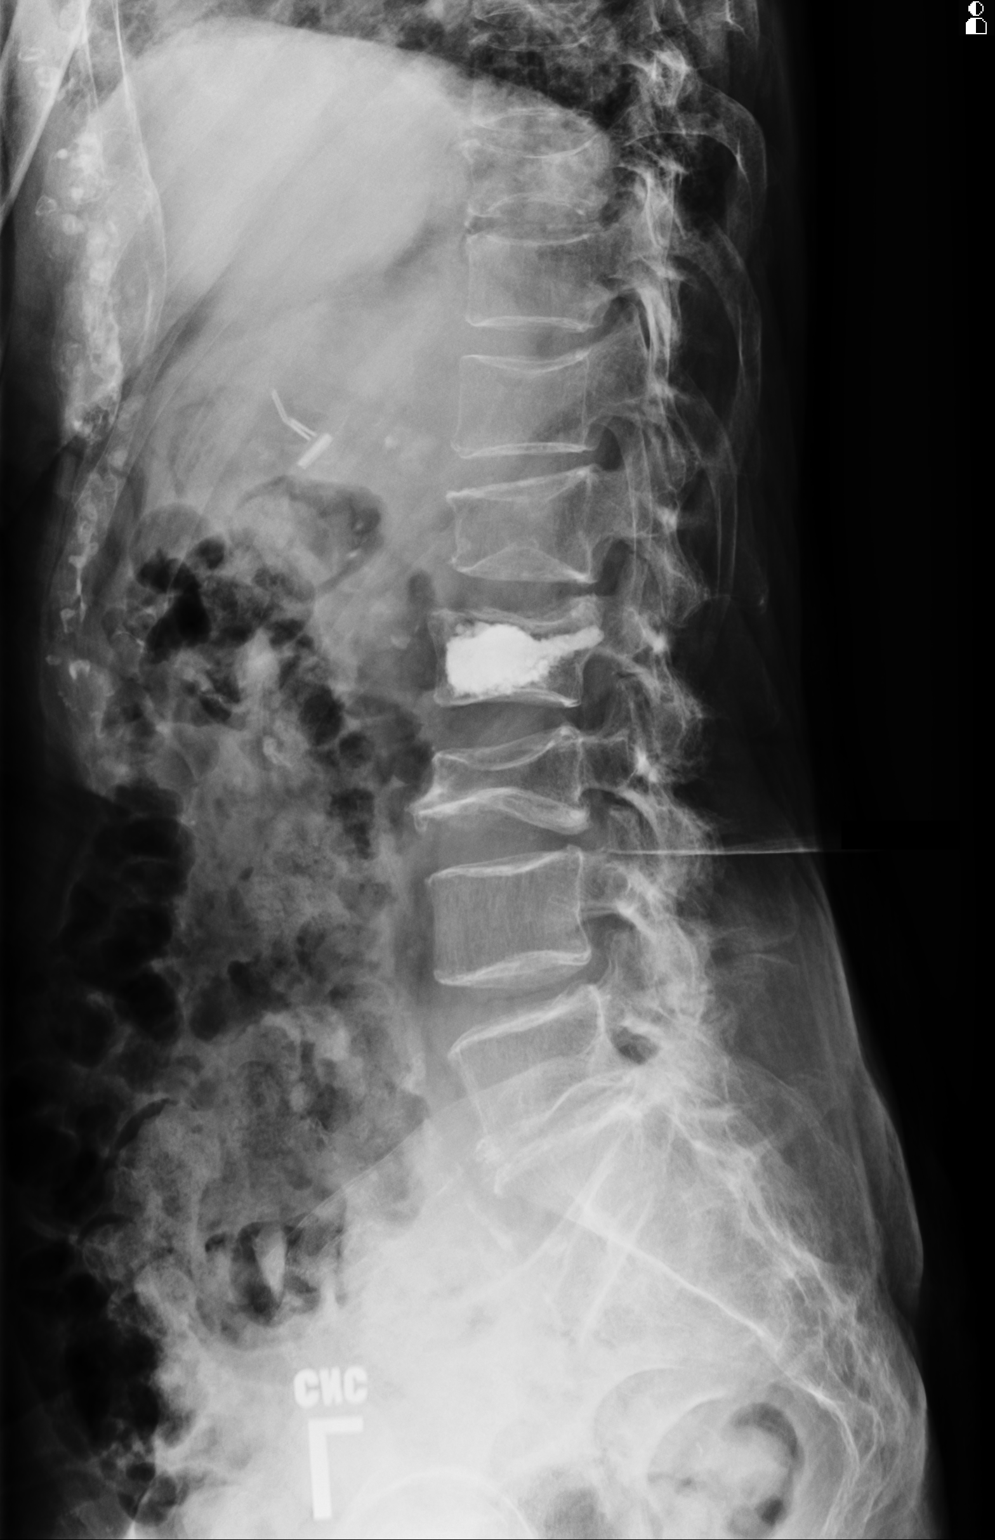
[im 2/3]
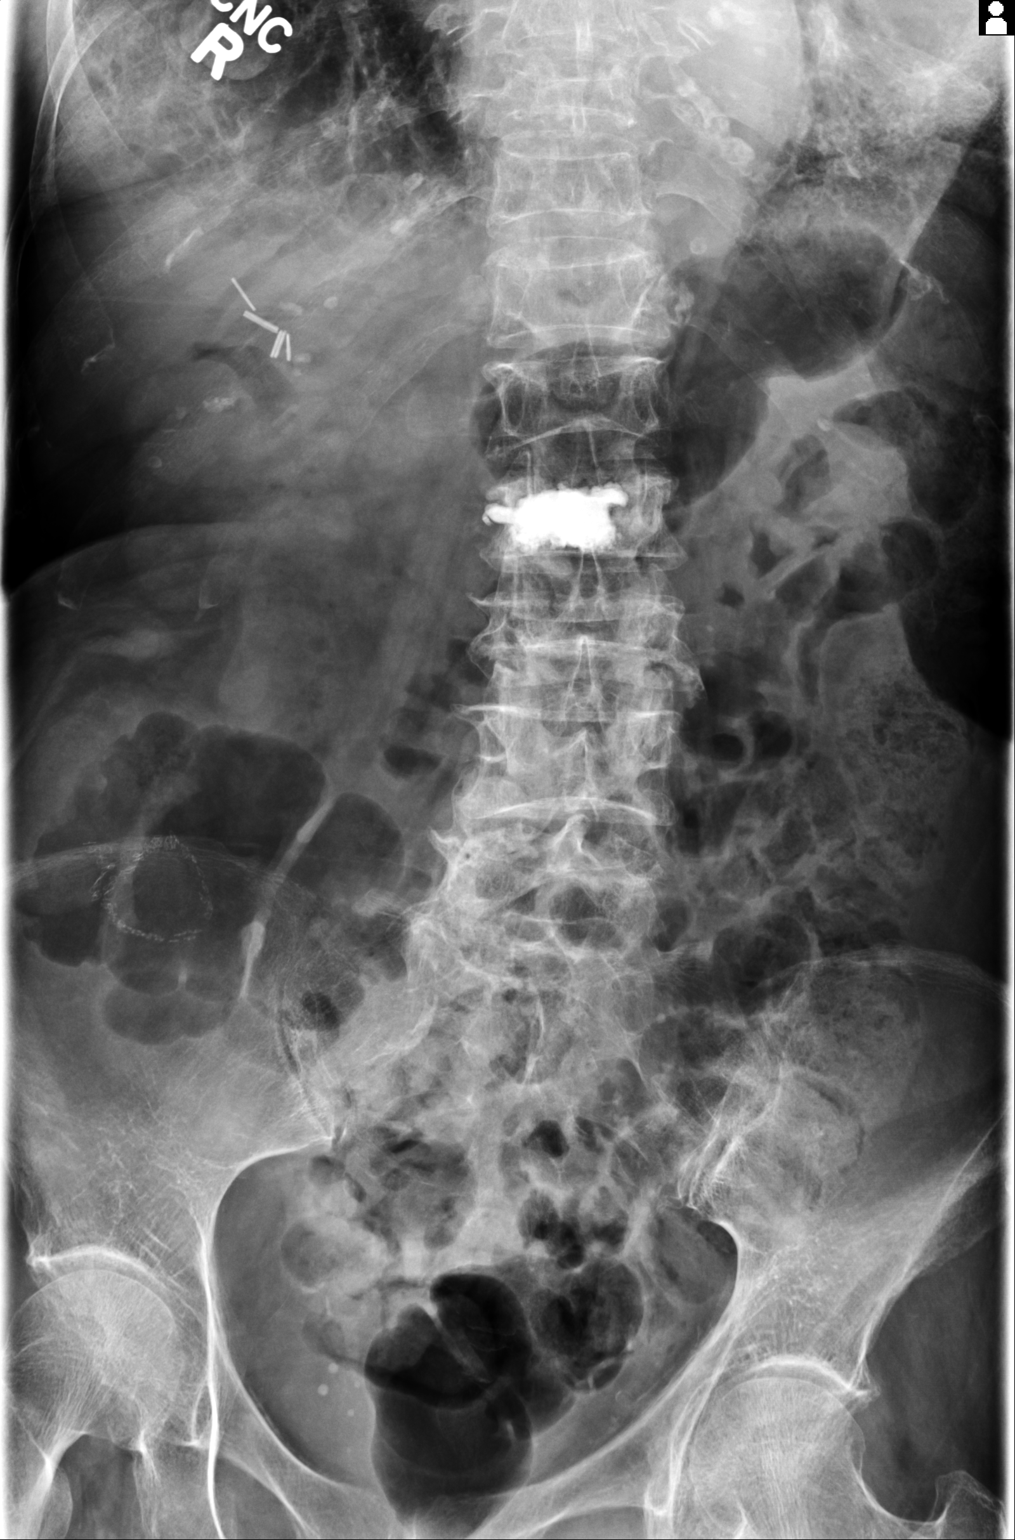
[im 3/3]
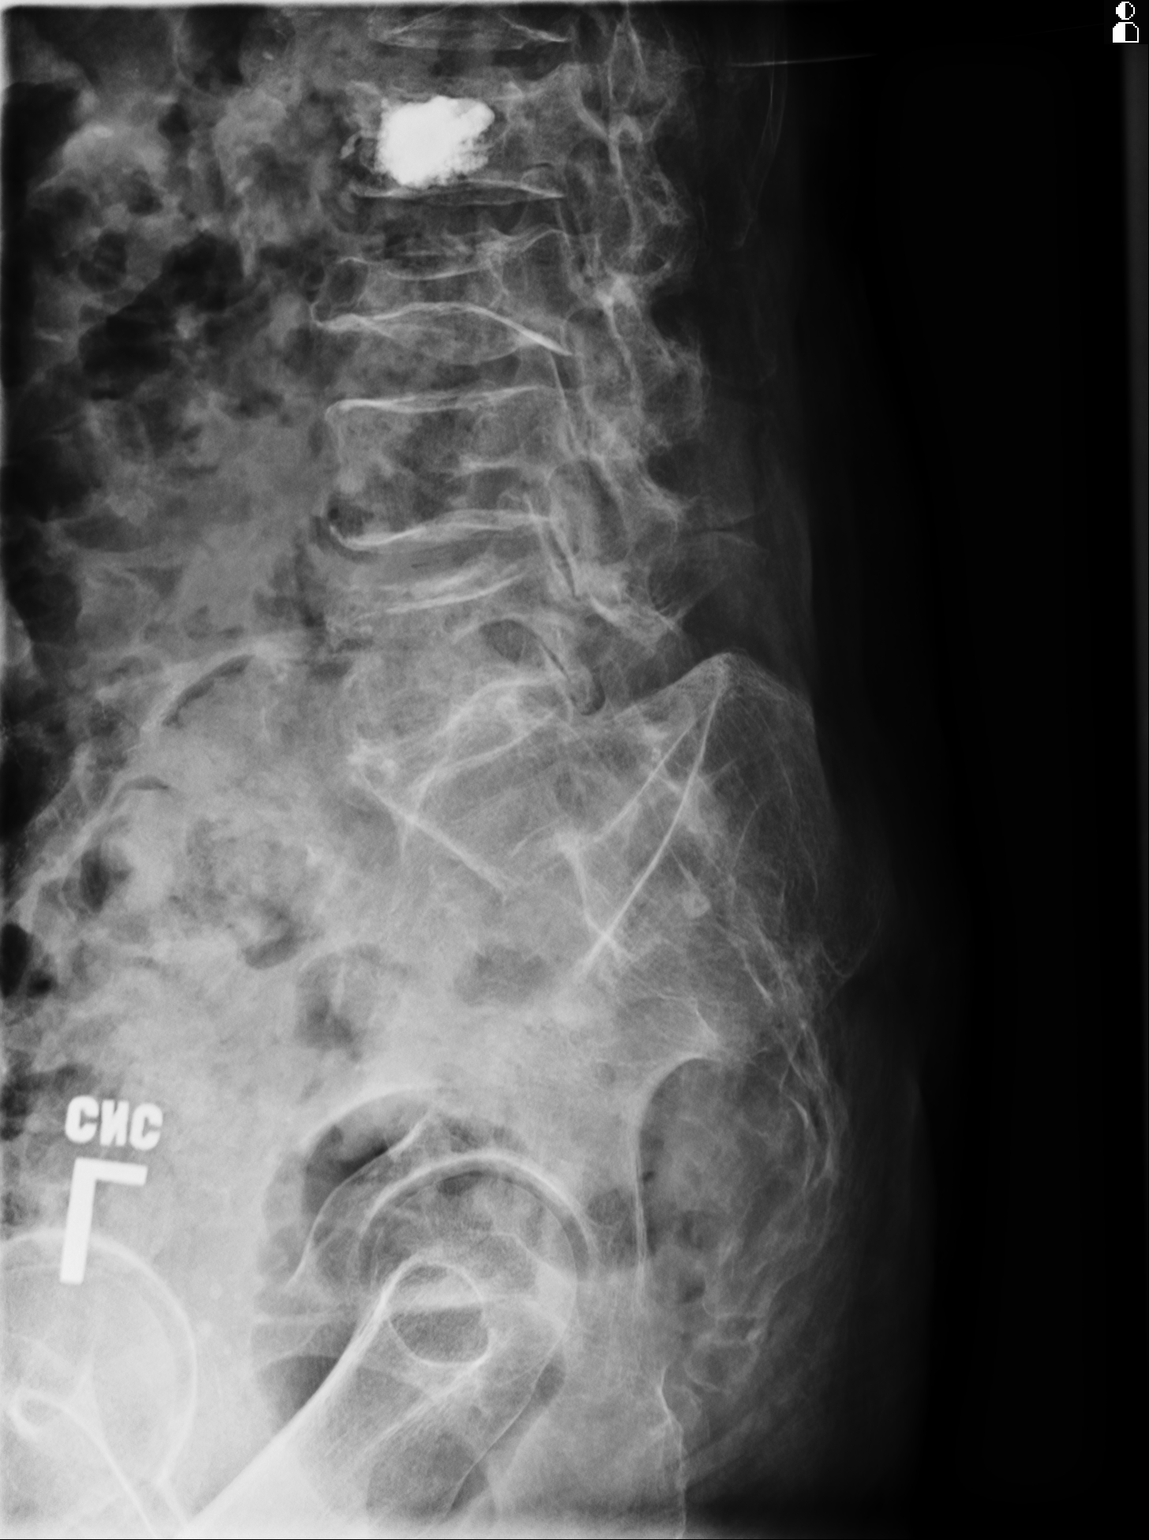

[3 of 3 positions shown; findings below may reference images not displayed]

PROCEDURE:     DXR - DXR LUMBAR SPINE AP AND LATERAL  - January 09, 2013  [DATE]

RESULT:     Comparison made to prior study of 12/14/2012. Diffuse
degenerative changes lumbar spine both hips with mild scoliosis concave
right. Surgical clips are present in the right upper quadrant.

Patient's had prior L2 kyphoplasty. Stable severe compression fracture of L3
present. Stable moderate compression fracture of L1.

T10 compression fracture is present , this is moderate and may be new.

Radiopacity projected over the posterior back is labeled " sponge", clinical
correlation suggested.
IMPRESSION: 1. Postsurgical changes lumbar spine. Patient's had prior L2 vertebral
plasty as described above.
2. Stable severe L3 compression fracture. Stable moderate L1 compression
fracture.
3. T10 moderate compression fracture. This may be new from prior study.
